# Patient Record
Sex: Female | Born: 1950
Health system: Southern US, Community
[De-identification: ages and names within clinical notes are randomized; demographics above are authoritative.]

## PROBLEM LIST (undated history)

## (undated) DIAGNOSIS — R7303 Prediabetes: Secondary | ICD-10-CM

## (undated) DIAGNOSIS — I10 Essential (primary) hypertension: Secondary | ICD-10-CM

## (undated) DIAGNOSIS — D126 Benign neoplasm of colon, unspecified: Secondary | ICD-10-CM

## (undated) DIAGNOSIS — K5792 Diverticulitis of intestine, part unspecified, without perforation or abscess without bleeding: Secondary | ICD-10-CM

## (undated) DIAGNOSIS — R112 Nausea with vomiting, unspecified: Secondary | ICD-10-CM

## (undated) DIAGNOSIS — M1990 Unspecified osteoarthritis, unspecified site: Secondary | ICD-10-CM

## (undated) DIAGNOSIS — E7800 Pure hypercholesterolemia, unspecified: Secondary | ICD-10-CM

## (undated) DIAGNOSIS — K5790 Diverticulosis of intestine, part unspecified, without perforation or abscess without bleeding: Secondary | ICD-10-CM

## (undated) DIAGNOSIS — Z9889 Other specified postprocedural states: Secondary | ICD-10-CM

## (undated) DIAGNOSIS — Z87442 Personal history of urinary calculi: Secondary | ICD-10-CM

## (undated) HISTORY — PX: TUMOR REMOVAL: SHX12

## (undated) HISTORY — PX: ABDOMINAL HYSTERECTOMY: SHX81

## (undated) HISTORY — PX: OTHER SURGICAL HISTORY: SHX169

## (undated) HISTORY — PX: LITHOTRIPSY: SUR834

## (undated) HISTORY — DX: Unspecified osteoarthritis, unspecified site: M19.90

## (undated) HISTORY — DX: Diverticulosis of intestine, part unspecified, without perforation or abscess without bleeding: K57.90

## (undated) HISTORY — PX: HEEL SPUR SURGERY: SHX665

## (undated) HISTORY — PX: BUNIONECTOMY: SHX129

## (undated) HISTORY — PX: JOINT REPLACEMENT: SHX530

## (undated) HISTORY — PX: KNEE SURGERY: SHX244

## (undated) HISTORY — DX: Benign neoplasm of colon, unspecified: D12.6

## (undated) HISTORY — PX: COLONOSCOPY W/ POLYPECTOMY: SHX1380

## (undated) HISTORY — PX: KNEE ARTHROSCOPY: SHX127

## (undated) HISTORY — DX: Diverticulitis of intestine, part unspecified, without perforation or abscess without bleeding: K57.92

## (undated) SURGERY — OPEN REDUCTION INTERNAL FIXATION FEMORAL SHAFT FRACTURE
Anesthesia: Choice | Laterality: Left

---

## 1998-07-03 ENCOUNTER — Other Ambulatory Visit: Admission: RE | Admit: 1998-07-03 | Discharge: 1998-07-03 | Payer: Self-pay | Admitting: Obstetrics & Gynecology

## 2000-07-08 ENCOUNTER — Other Ambulatory Visit: Admission: RE | Admit: 2000-07-08 | Discharge: 2000-07-08 | Payer: Self-pay | Admitting: *Deleted

## 2000-07-16 ENCOUNTER — Ambulatory Visit (HOSPITAL_COMMUNITY): Admission: RE | Admit: 2000-07-16 | Discharge: 2000-07-16 | Payer: Self-pay | Admitting: Gastroenterology

## 2000-07-22 ENCOUNTER — Inpatient Hospital Stay (HOSPITAL_COMMUNITY): Admission: RE | Admit: 2000-07-22 | Discharge: 2000-07-26 | Payer: Self-pay | Admitting: *Deleted

## 2000-08-09 ENCOUNTER — Ambulatory Visit (HOSPITAL_COMMUNITY): Admission: RE | Admit: 2000-08-09 | Discharge: 2000-08-09 | Payer: Self-pay | Admitting: *Deleted

## 2000-10-13 ENCOUNTER — Inpatient Hospital Stay (HOSPITAL_COMMUNITY): Admission: RE | Admit: 2000-10-13 | Discharge: 2000-10-17 | Payer: Self-pay | Admitting: Surgery

## 2000-10-13 ENCOUNTER — Encounter (INDEPENDENT_AMBULATORY_CARE_PROVIDER_SITE_OTHER): Payer: Self-pay

## 2001-08-30 ENCOUNTER — Other Ambulatory Visit: Admission: RE | Admit: 2001-08-30 | Discharge: 2001-08-30 | Payer: Self-pay | Admitting: *Deleted

## 2001-08-30 ENCOUNTER — Encounter: Admission: RE | Admit: 2001-08-30 | Discharge: 2001-08-30 | Payer: Self-pay | Admitting: *Deleted

## 2001-10-24 ENCOUNTER — Ambulatory Visit (HOSPITAL_COMMUNITY): Admission: RE | Admit: 2001-10-24 | Discharge: 2001-10-24 | Payer: Self-pay | Admitting: *Deleted

## 2001-11-07 ENCOUNTER — Encounter: Payer: Self-pay | Admitting: Surgery

## 2001-11-07 ENCOUNTER — Ambulatory Visit (HOSPITAL_COMMUNITY): Admission: RE | Admit: 2001-11-07 | Discharge: 2001-11-07 | Payer: Self-pay | Admitting: Surgery

## 2002-12-19 ENCOUNTER — Ambulatory Visit (HOSPITAL_COMMUNITY): Admission: RE | Admit: 2002-12-19 | Discharge: 2002-12-19 | Payer: Self-pay | Admitting: Surgery

## 2002-12-19 ENCOUNTER — Encounter: Payer: Self-pay | Admitting: Surgery

## 2003-12-25 ENCOUNTER — Ambulatory Visit (HOSPITAL_COMMUNITY): Admission: RE | Admit: 2003-12-25 | Discharge: 2003-12-25 | Payer: Self-pay | Admitting: Surgery

## 2007-05-24 ENCOUNTER — Encounter: Admission: RE | Admit: 2007-05-24 | Discharge: 2007-05-24 | Payer: Self-pay | Admitting: Surgery

## 2008-08-23 ENCOUNTER — Encounter: Admission: RE | Admit: 2008-08-23 | Discharge: 2008-08-23 | Payer: Self-pay | Admitting: Unknown Physician Specialty

## 2009-12-24 ENCOUNTER — Encounter: Admission: RE | Admit: 2009-12-24 | Discharge: 2009-12-24 | Payer: Self-pay | Admitting: Unknown Physician Specialty

## 2010-09-12 NOTE — Discharge Summary (Signed)
[place]. [facility]  Patient:    Mary Barnett, Mary Barnett                       MRN: [id number] Adm. Date:  [id number] Disc. Date: [id number] Attending:  Marin Comment CC:         [place] Daphine Deutscher, M.D.   Discharge Summary  PROBLEM:  Lower abdominal pain and pelvic mass.  HISTORY OF PRESENT ILLNESS:  For details of the patients admission history and physical, please see the transcribed note dated July 22, 2000.  Briefly, the patient is a relatively asymptomatic woman who presented for routine care with mild complaints of lower abdominal pain.  On examination, she was found to have a large pelvic mass and on sonogram this was felt to be consistent with an ovarian malignancy.  She was brought to the operating room for exploration regarding this mass.  HOSPITAL COURSE:  The patient was taken to the operating room on July 22, 2000, the day of admission.  Total abdominal hysterectomy, bilateral salpingo-oophorectomy, right ureterolysis and exposure and biopsy of retroperitoneal tumor were performed.  The patient had a presacral retroperitoneal tumor approximately 9 cm in size. This mass was not connected in any way to the uterus but this could only be discerned after removing the uterus and removing the ureter from the sidewall of the mass.  Intraoperative consultation was obtained from [facility]. Daphine Deutscher, M.D., who recommended a more thorough work-up of the mass by CT and possible MRI.  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  The patients postoperative course was uncomplicated. She had an epidural anesthesia which controlled her pain well and she needed little pain medication after it was discontinued.  She had a steady improving course throughout the postoperative stay.  She began to take a liquid diet on postoperative day #2 and was advanced rapidly to a regular diet on July 25, 2000.  She was able to be discharged on  July 26, 2000, following abdominal pelvic CT scan.  During her hospitalization she had had several temperature elevations of 101.1.  Once the epidural anesthesia was discontinued, her temperature remained normal.  CONDITION ON DISCHARGE:  Stable and improved pending final diagnosis.  DISCHARGE INSTRUCTIONS:  She was given a discharge instruction sheet regarding activity and use of medications as well as problems to alert the physician.  DISCHARGE MEDICATIONS: 1. She was given a prescription for Darvocet-N 100 #40 to take every four    hours as needed for pain. 2. She was also given a prescription for Chromagen to take one daily.  DISCHARGE LABS:  The patients hemoglobin at the time of discharge was 9.7. She received two doses of p.o. potassium for a serum potassium level of 3.4. Urine culture was obtained on July 25, 2000, and showed no evidence of urinary tract infection. DD:  07/26/00 TD:  07/26/00 Job: [id number]

## 2010-09-12 NOTE — Procedures (Signed)
[place]. [facility]  Patient:    Mary Barnett, Mary Barnett                       MRN: [id number] Proc. Date: 07/22/00 Adm. Date:  [id number] Attending:  Marin Comment                           Procedure Report  PREOPERATIVE DIAGNOSIS:  Abdominal mass versus tumor.  POSTOPERATIVE DIAGNOSIS:  Abdominal mass versus tumor.  OPERATION PERFORMED:  Exploratory laparotomy with total abdominal hysterectomy bilateral salpingo-oophorectomy performed by Dr. Gildardo Griffes.  ANESTHESIA PROCEDURE:  Placement of lumber epidural catheter postoperative analgesia.  Preoperatively, the risks and benefits of placing a lumbar epidural catheter were discussed in detail with the patient.  In addition this has been requested by Dr. Carey Bullocks.  During the case it was determined that this was, in fact, a posteriorly placed mass in the pelvic area and perhaps might have been a tumor of other origin perhaps even a tumor of neuro origin.  Dr. Carey Bullocks did discuss this with me before the end of the case questioning whether we should proceed with epidural since this was in the pelvic area and further away from the lumbar.  I did feel that she would benefit from the epidural placement for postoperative analgesia.  We elected to proceed with the placement of the epidural catheter and will be monitoring her closely for pain control in the next couple of days with the epidural catheter in place.  DESCRIPTION OF PROCEDURE:  The patient was turned to the left lateral decubitus position.  The sterile prep of the lumbar area was conducted.  Using a #17 gauge Tuohy needle adjacent to the L2-3 interspace, the epidural space was contacted with a loss of resistance technique and a catheter threaded with ease approximately 3 to 4 cm beyond the needle tip and the needle was removed. After negative aspiration for both heme and CSF, the catheter was injected with a total of 6 cc of 0.25%  Marcaine which  also contained 100 mcg of fentanyl.  This was secured in place with tape and the patient turned supine, extubated and transferred to the PACU in stable condition.  DISPOSITION:  The patient will be followed daily by the department of anesthesiology for postoperative analgesia via the epidural catheter.DD: 07/22/00 TD:  07/22/00 Job: [id number]

## 2010-09-12 NOTE — Op Note (Signed)
[facility]  Patient:    Mary Barnett, Mary Barnett                    MRN: [id number] Proc. Date: 10/13/00 Adm. Date:  [id number] Attending:  Katha Cabal CC:         [place] Park. Daphine Deutscher, M.D.   Operative Report  INDICATIONS FOR PROCEDURE:  Ms. 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 Daphine Deutscher had a question about the location and condition of the right ureter. They could not identify the right ureter and injected indigo carmine and did not see any blue stain in urine but asked me to scrub in to give my opinion about where the ureter might be.  DESCRIPTION OF PROCEDURE:  After scrubbing in the case, I evaluated the left retroperitoneal space and identified the major iliac vessels on the right side. There was a lot of inflammation and scarring. The area of resection did not look like it included the right ureter. The ureter most likely was excluded from this spindle cell resected area and I felt the ureter was probably intact based on the dissection that had been done, lack of blue stain dye in the wound and Dr. Daphine Deutscher did not feel like he had divided any tubular structures. I then scrubbed out of the case and the procedure was completed by Dr. Daphine Deutscher. DD:  10/13/00 TD:  10/13/00 Job: [id number]

## 2010-09-12 NOTE — Op Note (Signed)
[facility]  Patient:    Mary Barnett, Mary Barnett                    MRN: [id number] Proc. Date: 10/13/00 Adm. Date:  [id number] Attending:  Katha Cabal CC:         Mary Barnett, M.D., [place], [place].  Pershing Cox, M.D.   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  PROCEDURE:  Excision of presacral spindle cell neoplasm.  SURGEON:  Thornton Park. Daphine Deutscher, M.D.  ASSISTANT:  Rose Phi. Maple Hudson, M.D.  INTRAOPERATIVE CONSULTANT:  Maretta Bees. Vonita Moss, M.D.  ANESTHESIA:  General endotracheal.  ESTIMATED BLOOD LOSS:  1400 cc.  DRAINS:  One #14 Blake drain in the pelvis.  DESCRIPTION OF PROCEDURE:  [name] was taken to room #1 and given general anesthesia. The abdomen was prepped with Betadine and draped sterilely. I excised her old scar and entered the abdomen to the midline below the umbilicus without difficulty. Upon entering, I found her omentum stuck up in the anterior abdominal wall and I took this down in a way and was able to reduce the omentum up into the upper abdomen. I found the sigmoid colon and found this and went down into the pelvis and it was pretty well stuck over the mass. I could feel the mass but in the absence of her uterus and her previous hysterectomy, there was a tremendous inflammatory response in that region. I went anterior and began the dissection. It was a blunt dissection staying right on the mass. I was able to once entering the pelvis just get around this and work my way around it. It did generate a fair amount of venous oozing which contributed to the aggregate blood loss. The patient, however, remained stable throughout this. The patient had type and cross match performed. This was a tedious firmly fixed mass which just took time to free up using blunt dissection. I was able to get all the way around this and  ultimately remove the mass and its inferior lobules from the pelvis. This appeared to be coming right on the sacral nerve roots as there is a nice white shiny area right on the bone where this seemed to take its origin. Once removed, it was sent for pathology and because of the nature of the inflammatory process around this, I asked Dr. Vonita Moss to come in and examine her right ureter and we felt that the ureter was lying well anterior to our point of dissection. Also since I had been using blunt dissection and did not transected anything. We did give her methylene blue and did not see any evidence of leakage. I then irrigated the pelvis and then used the electrocautery and clips and sutures to control little oozing areas. In the end right down there on the bone, there was a little bit of oozing which we werent able to control initially with the electrocautery and then applied Surgicel and direct firm pressure and this seemed to control that. Upon irrigation, there was no evidence of active bleeding. I then placed a fully perforated Blake drain into the pelvis and I did close the area that I had entered the pelvis around this and brought this out through a separate stab incision in the right lower quadrant. The omental apron was then brought down over the wound. The wound itself was closed closing the peritoneum with a running #0  Vicryl. A #1 Prolene from above and below was run down to the middle of the incision and tied together. The wound again was irrigated and the skin was closed with vertical mattress sutures of 3-0 nylon as well as staples. The patient seemed to tolerate the procedure well. H&H will be obtained in the recovery room to assess whether she needs to have a transfusion. Epidural was placed by anesthesia. DD:  10/13/00 TD:  10/16/00 Job: [id number]

## 2010-09-12 NOTE — Procedures (Signed)
[place]. [facility]  Patient:    Mary Barnett, Mary Barnett                       MRN: [id number] Proc. Date: 07/16/00 Adm. Date:  [id number] Attending:  Charna Elizabeth CC:         Pershing Cox, M.D.  Thorn[place] Park Daphine Deutscher, M.D.   Procedure Report  DATE OF BIRTH:  1951-03-06  PROCEDURE PERFORMED:  Colonoscopy.  ENDOSCOPIST:  Anselmo Rod, M.D.  INSTRUMENT USED:  Olympus video colonoscope.  INDICATIONS:  A 60 year old white female with a large solid mass in the abdomen, question ovarian cancer and rectal mass on digital examination. Preoperative colonoscopy is being done to clear the colon before resection of the abdominal mass.  Patient had trace guaiac positive stools in the office today.  PREPROCEDURE PREPARATION:  Informed consent was procured from the patient. The patient was fasted for 8 hours prior to the procedure and prepped with a bottle of magnesium citrate and a gallon of NuLytely the night prior to the procedure.  PREPROCEDURE PHYSICAL:  Patient has stable vital signs.  NECK: Supple.  CHEST:  Clear to auscultation. S1, S2 regular.  ABDOMEN:  Soft with normal abdominal bowel sounds, no hepatosplenomegaly.  No definite masses palpable.  On digital examination, there was a large solid mass palpable on digital examination with trace positive stools on occult blood testing.  DESCRIPTION OF PROCEDURE:  The patient was placed in the left lateral decubitus position and sedated with 70 mg of Demerol and 7 mg of Versed intravenously.  Once the patient was adequately sedated and maintained on low-flow oxygen and continuous cardiac monitoring, the Olympus video colonoscope was advanced from the rectum to the cecum without difficulty. Extrinsic compression was noted in the rectum and the rectosigmoid area with no definite mucosal lesions.  The procedure was completed up to the terminal ileum, which appeared healthy and normal, so did the cecum,  right colon, transverse colon and left colon.  Patient had small internal hemorrhoids appreciated on retroflexion and tolerated the procedure well without complication.  IMPRESSION: 1. Extrinsic compression of the rectum and rectosigmoid. 2. Small internal hemorrhoids. 3. Otherwise normal colonoscopy up to the terminal ileum.  RECOMMENDATIONS:  Proceed with surgery for removal of the abdominal masses planned by Dr. Carey Bullocks and Dr. Luretha Murphy on July 22, 2000. DD:  07/16/00 TD:  07/16/00 Job: [id number]

## 2010-09-12 NOTE — Op Note (Signed)
[place]. [facility]  Patient:    Mary Barnett, Mary Barnett                       MRN: [id number] Proc. Date: 07/22/00 Adm. Date:  [id number] Attending:  Marin Comment CC:         Sung Amabile. Roslyn Smiling, M.D.  [place] Daphine Deutscher, M.D.   Operative Report  PREOPERATIVE DIAGNOSIS:  Solid pelvic mass, presumed ovarian carcinoma.  POSTOPERATIVE DIAGNOSES:  Presacral retroperitoneal tumor on frozen section consistent with low-grade spindle tumor.  ANESTHESIA:  General endotracheal anesthesia.  SURGEON:  Pershing Cox, M.D.  ASSISTANT SURGEON:  Sung Amabile. Roslyn Smiling, M.D.  PROCEDURE:  Exploratory laparotomy, total abdominal hysterectomy, bilateral salpingo-oophorectomy, extensive right ureterolysis, exposure and biopsy of retroperitoneal tumor in the presacral space.  INDICATIONS FOR PROCEDURE:  The patient is a 60 year old female who presented with self referral with a 1 month history of lower abdominal pain.  The pain is intermittent and pinching in nature.  It is significantly worsened by having a full bladder.  She has not noted any change in her bowel habits. Prior to surgery she was evaluated by Dr. Anselmo Rod, M.D. who performed a colonoscopy on her.  She found only external pressure.  OPERATIVE FINDINGS:  Exploratory laparotomy showed smooth services of the upper abdomen.  There were no adhesions or implants on the liver or diaphragm surfaces.  The NG tube was palpated in the stomach.  This was later removed prior extubating the patient.  Both kidneys were retroperitoneal and normal in shape and size.  There were no noted periaortic nodes on palpation.  There were no pelvic abnormalities.  No peritoneal implants.  The appendix was normal in appearance.  The uterus was 8 weeks in size with regular shape. Both fallopian tubes and ovaries were normal in appearance.  There was a cyst on the right ovary which ruptured during the BSO.  There was a 8 cm solid  mass arising anterior to the sacrum extending the length of the sacrum down onto the coccyx.  This mass was loosely adherent to the pelvic sidewalls and to the posterior wall of the vagina.  The mass was firm in texture but not hard.  It was easily biopsied with Vim-Silverman needle.  Frozen section diagnoses returned as a low grade spindle tumor, no mitoses seen.  PROCEDURE:  Mary Barnett was brought to the operating room with an IV in place.  In the holding area she received antibiotics, receiving cefazolin 1 gram and gentamycin 60 mg.  She was taken to the operating room where supine on the operating room table general endotracheal anesthesia was administered without difficulty.  She was placed in the frog-legged position and the anterior abdominal wall, perineum and vagina were prepped with a solution of Hibiclens and folic acid was sterilely inserted.  She was then draped for a midline incision.  Subumbilical incision was made and carried down to the top of the symphysis. Subcutaneous tissues were opened with cautery exposing the fascia.  The fascia was divided exposing the underlying rectus muscles which were separated in the midline taking the incision down to the top of the symphysis.  Peritoneum was tented and opened atraumatically.  Large right angle retractors were used to lift the peritoneum and 500 cc of warm saline were instilled and then retrieved.  We actually only retrieved approximately 350 cc of this solution. A right-angled retractor was used to lift the abdomen and an  exam under anesthesia was performed (please see the note above).  Warm moist laps were then used to tack the bowel into the upper abdomen.  Buchwalter retractor was used to give [place] lateral exposure and a bladder blade protected the bladder.  The uterus was 8 weeks in size and normal in shape.  Both fallopian tubes and ovaries appear to be normal in appearance.  There was a cyst on the right ovary.  The  mass was clearly arising from the retroperitoneum.  Because of the position and the proximity to the uterosacral ligament I thought this might be a fibroid arising from the uterosacral.  I was also concerned that this could be an ectopic kidney although it was very low in the pelvis.  I decided at this time to proceed with TAH/BSO which was our planned operative procedure so that I could get safely down into the spaces surrounding this mass and trace the ureter in its full course alongside the mass prior to doing any sort of biopsy.  Round ligaments were identified, suture ligated and transected.  Perineum along the IP ligaments was incised and the IP was gathered as a pedicle.  Care was taken not to include the ureter in this pedicle.  The pedicle was clamped, cut, suture, and free tie ligated.  The peritoneum along the broad ligament was incised lifting the ovaries superiorly onto 2 long Kelly clamps which secured the adnexa lateral to the uterus.  Perineum over the lower uterine segment was incised and the bladder was pushed down.  Uterine arteries were skeletonized, doubly clamped, and suture ligated.  Uterosacral ligaments were clamped with a right angled ______ clamp and suture Heaney ligated.  0 Vicryl sutures were used throughout the dissection.  We were able to enter the vagina on the patients left.  A second clamp was placed on the right and angled sutures were placed on each of these.  The cervix was excised from the upper vagina.  The vagina was whipped with a running locking suture of 0 Vicryl. The hemostasis was good and the vagina was closed anterior to posterior.  Next, lifting the medial peritoneum the ureter was exposed.  It was dissected along its course until it moved laterally through the tunnel.  I was able to convince myself that the ureter did not go into this mass in any way, shape or form.  At this point I began a dissection of the mass from the sidewalls  and  also from the posterior vagina.  We did this by lifting the posterior vagina and using sharp dissection and a series of small clips, each of the pedicles were clipped doubly and then cut.  As we went down the size of this mass it became clear that it was going deep into the pelvis.  Because of its approximation to the sacrum a decision was made to call for intraoperative consult and Dr. Molli Hazard B. Daphine Deutscher, was gracious to come and assist [place].  The patient had received a complete bowel prep prior to surgery in case a colon resection was necessary.  Dr. Daphine Deutscher scrubbed in and you will see his consultation note for the details of evaluation.  He felt that we needed more information on this patient prior to attempting resection and advised biopsy of the mass and then a thorough work-up with CT and perhaps MR prior to excision.  Biopsies were taken from the top of this mass using a Vim-Silverman needle. Biopsy was sent to frozen section and  returned as a low grade spindle cell tumor without mitoses.  Because of the proximity of the ureter to this mass and the almost certainty that it would fall back on to the top of this mass with adherence I took Gelfoam and line the mass with 2 layers separating the ureter from this dissection site as completely as possible.  Moist laps were removed.  Sponge and instrument count was correct.  The rectosigmoid was layered back over the top of this retroperitoneal mass.  All sites of suture were inspected and there was no evidence of bleeding.  Small bowel was drawn down over the rectosigmoid with the omentum over the small bowel.  The anterior abdominal wall was closed with double stranded 0 Prolene suturing from the top and bottom and inverting the stitch in the middle. Subcutaneous tissues made it hemostatic.  The patient received 0.50% Marcaine injected into subcutaneous tissues beneath the skin and the skin staples were then applied.  OPERATIVE  SPECIMENS INCLUDED:  Peritoneal washing, uterus, fallopian tubes, and ovaries, and biopsies of the presacral retroperitoneal mass.  FLUIDS:  2800 cc of crystalloids, 500 cc of Hespan.  ESTIMATED BLOOD LOSS:  300 cc.  URINE OUTPUT:  950 cc.  COMPLICATIONS:  None. DD:  07/22/00 TD:  07/22/00 Job: [id number]

## 2010-09-12 NOTE — Discharge Summary (Signed)
[facility]  Patient:    [name], [name]                    MRN: [id number] Adm. Date:  [id number] Disc. Date: [id number] Attending:  Katha Cabal                           Discharge Summary  ADMISSION DIAGNOSIS:  Pelvic mass.  DISCHARGE DIAGNOSIS:  Presacral benign schwannoma.  COURSE IN HOSPITAL:  Mary Barnett is a 60 year old lady who came in and underwent excision of a presacral squamous cell neoplasm.  She had an estimated blood loss of 1400 cc and had a J placed in her pelvis.  She did get two transfusions.  She seemed to perk up and did well otherwise.  She was ready for discharge on  postop day #4, October 17, 2000.  Arrangements were made for her to come back and see me in the office in two weeks.  At that time, pathology report was pending.  Since then, it has been felt to be a benign squamous cell neoplasm consistent with a benign schwannoma.  CONDITION UPON DISCHARGE:  Good. DD:  11/26/00 TD:  11/26/00 Job: [id number]

## 2010-09-12 NOTE — Consult Note (Signed)
[place]. [facility]  Patient:    Mary Barnett, Mary Barnett                       MRN: [id number] Adm. Date:  [id number] Attending:  Marin Comment CC:         Pershing Cox, M.D.  Anselmo Rod, M.D.   Consultation Report  HISTORY OF PRESENT ILLNESS:  Ms. Oubre had surgery in progress per Dr. Carey Bullocks today and I was called to see the patient in the operating room for a consultation regarding a pelvic mass.  The patient had been found to have a firm mass on rectal exam and workup suggested this was of ovarian origin.  Upon entering and performing a hysterectomy, Dr. Carey Bullocks found this to be more in the presacral area.  Intraoperative exam found this to be a firm, rubbery mass that seemed to have its origin down on the pelvic floor or down near the coccyx.  It was attached somewhat to the ______ of the sacrum.  Since no CT scan or MR were available, I felt that this could possibly be a sarcoma of sorts and might best be managed with biopsy with subsequent studies as indicated, possibly to include a CT scan of the pelvis and also an MR scan.  Multiple Tru-Cut needle biopsies were obtained through the mass and sent for permanent sections and frozen sections.  The patient will be seen postoperatively and further workup undertaken for a subsequent resection. DD:  07/22/00 TD:  07/23/00 Job: [id number]

## 2011-02-13 ENCOUNTER — Other Ambulatory Visit: Payer: Self-pay | Admitting: Family Medicine

## 2011-02-13 DIAGNOSIS — Z1231 Encounter for screening mammogram for malignant neoplasm of breast: Secondary | ICD-10-CM

## 2011-02-19 ENCOUNTER — Ambulatory Visit
Admission: RE | Admit: 2011-02-19 | Discharge: 2011-02-19 | Disposition: A | Discharge status: Home / Self Care | Payer: Commercial Indemnity | Source: Ambulatory Visit | Attending: Family Medicine | Admitting: Family Medicine

## 2011-02-19 DIAGNOSIS — Z1231 Encounter for screening mammogram for malignant neoplasm of breast: Secondary | ICD-10-CM

## 2012-09-13 ENCOUNTER — Other Ambulatory Visit: Payer: Self-pay | Admitting: Family Medicine

## 2012-09-13 ENCOUNTER — Other Ambulatory Visit: Payer: Self-pay

## 2012-09-13 DIAGNOSIS — Z1231 Encounter for screening mammogram for malignant neoplasm of breast: Secondary | ICD-10-CM

## 2012-09-29 ENCOUNTER — Ambulatory Visit
Admission: RE | Admit: 2012-09-29 | Discharge: 2012-09-29 | Disposition: A | Discharge status: Home / Self Care | Payer: Commercial Indemnity | Source: Ambulatory Visit | Attending: Family Medicine | Admitting: Family Medicine

## 2012-09-29 DIAGNOSIS — Z1231 Encounter for screening mammogram for malignant neoplasm of breast: Secondary | ICD-10-CM

## 2013-06-06 ENCOUNTER — Encounter: Payer: Self-pay | Admitting: Podiatrist

## 2013-06-06 ENCOUNTER — Ambulatory Visit (INDEPENDENT_AMBULATORY_CARE_PROVIDER_SITE_OTHER): Payer: Managed Care, Other (non HMO) | Admitting: Podiatrist

## 2013-06-06 ENCOUNTER — Ambulatory Visit (INDEPENDENT_AMBULATORY_CARE_PROVIDER_SITE_OTHER): Payer: Managed Care, Other (non HMO)

## 2013-06-06 VITALS — BP 135/82 | HR 92 | Resp 18

## 2013-06-06 DIAGNOSIS — M79609 Pain in unspecified limb: Secondary | ICD-10-CM

## 2013-06-06 DIAGNOSIS — M722 Plantar fascial fibromatosis: Secondary | ICD-10-CM

## 2013-06-06 MED ORDER — MELOXICAM 15 MG PO TABS: 15.0000 mg | ORAL_TABLET | Freq: Every day | ORAL | Status: DC

## 2013-06-06 MED ORDER — DICLOFENAC SODIUM 1 % TD GEL: 2.0000 g | Freq: Four times a day (QID) | TRANSDERMAL | Status: DC

## 2013-06-06 NOTE — Patient Instructions (Signed)
I am calling in Meloxicam (Mobic)-- it is an antiinflammatory medication-- take it daily with food for 2 weeks, then wean off the medication.    I am also calling in Voltaren Gel-  It is a topical antiinflammatory that works well for the plantar fasciitis.  You may apply it up to 4 times daily and massage into the area of discomfort on your heel.  Plantar Fasciitis (Heel Spur Syndrome) with Rehab The plantar fascia is a fibrous, ligament-like, soft-tissue structure that spans the bottom of the foot. Plantar fasciitis is a condition that causes pain in the foot due to inflammation of the tissue. SYMPTOMS   Pain and tenderness on the underneath side of the foot.  Pain that worsens with standing or walking. CAUSES  Plantar fasciitis is caused by irritation and injury to the plantar fascia on the underneath side of the foot. Common mechanisms of injury include:  Direct trauma to bottom of the foot.  Damage to a small nerve that runs under the foot where the main fascia attaches to the heel bone. Stress placed on the plantar fascia due to any mild increased activity or injury RISK INCREASES WITH:   Obesity.  Poor strength and flexibility.  Improperly fitted shoes.  Tight calf muscles.  Flat feet.  Failure to warm-up properly before activity.  PREVENTION  Warm up and stretch properly before activity.  Strength, flexibility  Maintain a health body weight.  Avoid stress on the plantar fascia.  Wear properly fitted shoes, including arch supports for individuals who have flat feet. PROGNOSIS  If treated properly, then the symptoms of plantar fasciitis usually resolve without surgery. However, occasionally surgery is necessary. RELATED COMPLICATIONS   Recurrent symptoms that may result in a chronic condition.  Problems of the lower back that are caused by compensating for the injury, such as limping.  Pain or weakness of the foot during push-off following surgery.  Chronic  inflammation, scarring, and partial or complete fascia tear, occurring more often from repeated injections. TREATMENT  Treatment initially involves the use of ice and medication to help reduce pain and inflammation. The use of strengthening and stretching exercises may help reduce pain with activity, especially stretches of the Achilles tendon.  Your caregiver may recommend that you use arch supports to help reduce stress on the plantar fascia. Often, corticosteroid injections are given to reduce inflammation. If symptoms persist for greater than 6 months despite non-surgical (conservative), then surgery may be recommended.  MEDICATION   If pain medication is necessary, then nonsteroidal anti-inflammatory medications, such as aspirin and ibuprofen, or other minor pain relievers, such as acetaminophen, are often recommended. Corticosteroid injections may be given by your caregiver.  HEAT AND COLD  Cold treatment (icing) relieves pain and reduces inflammation. Cold treatment should be applied for 10 to 15 minutes every 2 to 3 hours for inflammation and pain and immediately after any activity that aggravates your symptoms. Use ice packs or massage the area with a piece of ice (ice massage).  Heat treatment may be used prior to performing the stretching and strengthening activities prescribed by your caregiver, physical therapist, or athletic trainer. Use a heat pack or soak the injury in warm water. SEEK IMMEDIATE MEDICAL CARE IF:  Treatment seems to offer no benefit, or the condition worsens.  Any medications produce adverse side effects.    EXERCISES-- perform each exercise a total of 10-15 repetitions.  Hold for 30 seconds and perform 3 times per day   RANGE OF MOTION (ROM)  AND STRETCHING EXERCISES - Plantar Fasciitis (Heel Spur Syndrome) These exercises may help you when beginning to rehabilitate your injury.   While completing these exercises, remember:   Restoring tissue flexibility  helps normal motion to return to the joints. This allows healthier, less painful movement and activity.  An effective stretch should be held for at least 30 seconds.  A stretch should never be painful. You should only feel a gentle lengthening or release in the stretched tissue. RANGE OF MOTION - Toe Extension, Flexion  Sit with your right / left leg crossed over your opposite knee.  Grasp your toes and gently pull them back toward the top of your foot. You should feel a stretch on the bottom of your toes and/or foot.  Hold this stretch for __________ seconds.  Now, gently pull your toes toward the bottom of your foot. You should feel a stretch on the top of your toes and or foot.  Hold this stretch for __________ seconds. Repeat __________ times. Complete this stretch __________ times per day.  RANGE OF MOTION - Ankle Dorsiflexion, Active Assisted  Remove shoes and sit on a chair that is preferably not on a carpeted surface.  Place right / left foot under knee. Extend your opposite leg for support.  Keeping your heel down, slide your right / left foot back toward the chair until you feel a stretch at your ankle or calf. If you do not feel a stretch, slide your bottom forward to the edge of the chair, while still keeping your heel down.  Hold this stretch for __________ seconds. Repeat __________ times. Complete this stretch __________ times per day.  STRETCH  Gastroc, Standing  Place hands on wall.  Extend right / left leg, keeping the front knee somewhat bent.  Slightly point your toes inward on your back foot.  Keeping your right / left heel on the floor and your knee straight, shift your weight toward the wall, not allowing your back to arch.  You should feel a gentle stretch in the right / left calf. Hold this position for __________ seconds. Repeat __________ times. Complete this stretch __________ times per day. STRETCH  Soleus, Standing  Place hands on wall.  Extend  right / left leg, keeping the other knee somewhat bent.  Slightly point your toes inward on your back foot.  Keep your right / left heel on the floor, bend your back knee, and slightly shift your weight over the back leg so that you feel a gentle stretch deep in your back calf.  Hold this position for __________ seconds. Repeat __________ times. Complete this stretch __________ times per day. STRETCH  Gastrocsoleus, Standing  Note: This exercise can place a lot of stress on your foot and ankle. Please complete this exercise only if specifically instructed by your caregiver.   Place the ball of your right / left foot on a step, keeping your other foot firmly on the same step.  Hold on to the wall or a rail for balance.  Slowly lift your other foot, allowing your body weight to press your heel down over the edge of the step.  You should feel a stretch in your right / left calf.  Hold this position for __________ seconds.  Repeat this exercise with a slight bend in your right / left knee. Repeat __________ times. Complete this stretch __________ times per day.  STRENGTHENING EXERCISES - Plantar Fasciitis (Heel Spur Syndrome)  These exercises may help you when beginning to  rehabilitate your injury. They may resolve your symptoms with or without further involvement from your physician, physical therapist or athletic trainer. While completing these exercises, remember:   Muscles can gain both the endurance and the strength needed for everyday activities through controlled exercises.  Complete these exercises as instructed by your physician, physical therapist or athletic trainer. Progress the resistance and repetitions only as guided.

## 2013-06-06 NOTE — Progress Notes (Signed)
° °  Subjective:    Patient ID: Mary Barnett, female    DOB: [birth year], 63 y.o.   MRN: [id number]  HPI Mischele presents today for pain in the arch of the left foot.  She states "About two weeks ago I was walking in my house and I felt a pop in the arch of the my left foot and went to the orthopaedic doctor prior to this  and but he didn't do any x -rays and does hurt on top and he did give me an injection and about a month ago and did give me a brace"  She refers to a standard ankle stabilizing brace.  She states cortisone injections give her facial flushing.  She relates she has been diagnosed with arthritis in the foot and has had several injections in the past-- she is concerned that this is not arthritis and possibly plantar fasciitis.     Review of Systems  Musculoskeletal: Positive for gait problem.  All other systems reviewed and are negative.       Objective:   Physical Exam GENERAL APPEARANCE: Alert, conversant. Appropriately groomed. No acute distress.  VASCULAR: Pedal pulses palpable at 2/4 DP and PT bilateral.  Capillary refill time is immediate to all digits,  Proximal to distal cooling it warm to warm.  Digital hair growth is present bilateral  NEUROLOGIC: sensation is intact epicritically and protectively to 5.07 monofilament at 5/5 sites bilateral.  Light touch is intact bilateral, vibratory sensation intact bilateral, achilles tendon reflex is intact bilateral.  MUSCULOSKELETAL: pain on palpation plantar medial arch is noted at the insertion of the plantar fascia on the medial calcaneal tubercle.  Plantar fascia is palpated and appears to be intact from proximal to distal attachments.  No defect is palpated.    DERMATOLOGIC: skin color, texture, and turger are within normal limits.  No preulcerative lesions are seen, no interdigital maceration noted.  No open lesions present.  Digital nails are asymptomatic.    Assessment & Plan:  Plantar fasciitis left  Plan:  Recommended  meloxicam and voltaren gel for her to use.  A plantar fascial taping was applied as well as stretching and massage exercises.  We also discussed the benefit of custom inserts and we will check her insurance coverage for these.  She will be seen in 4 weeks for recheck and may require an injection at that time.

## 2013-07-14 ENCOUNTER — Encounter (HOSPITAL_COMMUNITY): Payer: Self-pay | Admitting: Emergency Medicine

## 2013-07-14 ENCOUNTER — Emergency Department (HOSPITAL_COMMUNITY): Payer: Worker's Compensation | Admitting: Anesthesiology

## 2013-07-14 ENCOUNTER — Encounter (HOSPITAL_COMMUNITY)
Admission: EM | Disposition: A | Discharge status: Home Health | Payer: Self-pay | Source: Home / Self Care | Attending: Orthopedic Surgery

## 2013-07-14 ENCOUNTER — Emergency Department (HOSPITAL_COMMUNITY): Payer: Worker's Compensation

## 2013-07-14 ENCOUNTER — Encounter (HOSPITAL_COMMUNITY): Payer: Worker's Compensation | Admitting: Anesthesiology

## 2013-07-14 ENCOUNTER — Inpatient Hospital Stay (HOSPITAL_COMMUNITY)
Admission: EM | Admit: 2013-07-14 | Discharge: 2013-07-15 | DRG: 482 | Disposition: A | Discharge status: Home Health | Payer: Worker's Compensation | Attending: Orthopedic Surgery | Admitting: Orthopedic Surgery

## 2013-07-14 DIAGNOSIS — Z7982 Long term (current) use of aspirin: Secondary | ICD-10-CM

## 2013-07-14 DIAGNOSIS — S7292XA Unspecified fracture of left femur, initial encounter for closed fracture: Secondary | ICD-10-CM

## 2013-07-14 DIAGNOSIS — X500XXA Overexertion from strenuous movement or load, initial encounter: Secondary | ICD-10-CM | POA: Diagnosis present

## 2013-07-14 DIAGNOSIS — E7800 Pure hypercholesterolemia, unspecified: Secondary | ICD-10-CM | POA: Diagnosis present

## 2013-07-14 DIAGNOSIS — Y92009 Unspecified place in unspecified non-institutional (private) residence as the place of occurrence of the external cause: Secondary | ICD-10-CM

## 2013-07-14 DIAGNOSIS — S72309A Unspecified fracture of shaft of unspecified femur, initial encounter for closed fracture: Principal | ICD-10-CM | POA: Diagnosis present

## 2013-07-14 DIAGNOSIS — Z79899 Other long term (current) drug therapy: Secondary | ICD-10-CM

## 2013-07-14 DIAGNOSIS — S72302A Unspecified fracture of shaft of left femur, initial encounter for closed fracture: Secondary | ICD-10-CM | POA: Diagnosis present

## 2013-07-14 DIAGNOSIS — W010XXA Fall on same level from slipping, tripping and stumbling without subsequent striking against object, initial encounter: Secondary | ICD-10-CM | POA: Diagnosis present

## 2013-07-14 HISTORY — DX: Pure hypercholesterolemia, unspecified: E78.00

## 2013-07-14 HISTORY — PX: FEMUR IM NAIL: SHX1597

## 2013-07-14 LAB — CBC WITH DIFFERENTIAL/PLATELET
BASOS PCT: 0 % (ref 0–1)
Basophils Absolute: 0 10*3/uL (ref 0.0–0.1)
EOS ABS: 0 10*3/uL (ref 0.0–0.7)
EOS PCT: 0 % (ref 0–5)
HCT: 38.7 % (ref 36.0–46.0)
HEMOGLOBIN: 12.9 g/dL (ref 12.0–15.0)
LYMPHS PCT: 10 % — AB (ref 12–46)
Lymphs Abs: 1.2 10*3/uL (ref 0.7–4.0)
MCH: 30.6 pg (ref 26.0–34.0)
MCHC: 33.3 g/dL (ref 30.0–36.0)
MCV: 91.7 fL (ref 78.0–100.0)
MONOS PCT: 6 % (ref 3–12)
Monocytes Absolute: 0.7 10*3/uL (ref 0.1–1.0)
NEUTROS PCT: 83 % — AB (ref 43–77)
Neutro Abs: 9.7 10*3/uL — ABNORMAL HIGH (ref 1.7–7.7)
Platelets: 179 10*3/uL (ref 150–400)
RBC: 4.22 MIL/uL (ref 3.87–5.11)
RDW: 13 % (ref 11.5–15.5)
WBC: 11.7 10*3/uL — AB (ref 4.0–10.5)

## 2013-07-14 LAB — COMPREHENSIVE METABOLIC PANEL
ALT: 24 U/L (ref 0–35)
AST: 21 U/L (ref 0–37)
Albumin: 3.6 g/dL (ref 3.5–5.2)
Alkaline Phosphatase: 56 U/L (ref 39–117)
BUN: 16 mg/dL (ref 6–23)
CO2: 28 meq/L (ref 19–32)
CREATININE: 0.56 mg/dL (ref 0.50–1.10)
Calcium: 9.1 mg/dL (ref 8.4–10.5)
Chloride: 103 mEq/L (ref 96–112)
GFR calc non Af Amer: >90 mL/min (ref >90)
GLUCOSE: 124 mg/dL — AB (ref 70–99)
Potassium: 4.1 mEq/L (ref 3.7–5.3)
Sodium: 143 mEq/L (ref 137–147)
Total Bilirubin: 0.4 mg/dL (ref 0.3–1.2)
Total Protein: 6.5 g/dL (ref 6.0–8.3)

## 2013-07-14 SURGERY — INSERTION, INTRAMEDULLARY ROD, FEMUR
Anesthesia: General | Site: Leg Upper | Laterality: Left

## 2013-07-14 MED ORDER — MIDAZOLAM HCL 2 MG/2ML IJ SOLN
INTRAMUSCULAR | Status: AC
  Filled 2013-07-14: qty 2

## 2013-07-14 MED ORDER — PROPOFOL 10 MG/ML IV BOLUS
INTRAVENOUS | Status: AC
  Filled 2013-07-14: qty 20

## 2013-07-14 MED ORDER — SUFENTANIL CITRATE 50 MCG/ML IV SOLN
INTRAVENOUS | Status: AC
  Filled 2013-07-14: qty 1

## 2013-07-14 MED ORDER — LIDOCAINE HCL (CARDIAC) 20 MG/ML IV SOLN
INTRAVENOUS | Status: AC
  Filled 2013-07-14: qty 5

## 2013-07-14 MED ORDER — SUCCINYLCHOLINE CHLORIDE 20 MG/ML IJ SOLN
INTRAMUSCULAR | Status: AC
  Filled 2013-07-14: qty 1

## 2013-07-14 MED ORDER — MORPHINE SULFATE 4 MG/ML IJ SOLN
4.0000 mg | Freq: Once | INTRAMUSCULAR | Status: AC
  Administered 2013-07-14: 4 mg via INTRAVENOUS
  Filled 2013-07-14: qty 1

## 2013-07-14 MED ORDER — LACTATED RINGERS IV SOLN
INTRAVENOUS | Status: DC | PRN
  Administered 2013-07-14 – 2013-07-15 (×2): via INTRAVENOUS

## 2013-07-14 MED ORDER — SODIUM CHLORIDE 0.9 % IJ SOLN
INTRAMUSCULAR | Status: AC
  Filled 2013-07-14: qty 10

## 2013-07-14 SURGICAL SUPPLY — 46 items
BIT DRILL 3.8X6 NS (BIT) ×1 IMPLANT
BIT DRILL 5.3 NS (BIT) ×1 IMPLANT
BLADE SURG ROTATE 9660 (MISCELLANEOUS) ×1 IMPLANT
CORTICAL BONE SCR 5.0MM X 46MM (Screw) ×2 IMPLANT
COVER SURGICAL LIGHT HANDLE (MISCELLANEOUS) ×4 IMPLANT
DECANTER SPIKE VIAL GLASS SM (MISCELLANEOUS) ×1 IMPLANT
DRAPE STERI IOBAN 125X83 (DRAPES) ×2 IMPLANT
DRSG MEPILEX BORDER 4X4 (GAUZE/BANDAGES/DRESSINGS) ×2 IMPLANT
DRSG MEPILEX BORDER 4X8 (GAUZE/BANDAGES/DRESSINGS) ×2 IMPLANT
DURAPREP 26ML APPLICATOR (WOUND CARE) ×2 IMPLANT
ELECT CAUTERY BLADE 6.4 (BLADE) ×2 IMPLANT
ELECT REM PT RETURN 9FT ADLT (ELECTROSURGICAL) ×2
ELECTRODE REM PT RTRN 9FT ADLT (ELECTROSURGICAL) ×1 IMPLANT
EVACUATOR 1/8 PVC DRAIN (DRAIN) IMPLANT
GAUZE XEROFORM 5X9 LF (GAUZE/BANDAGES/DRESSINGS) ×1 IMPLANT
GLOVE BIOGEL PI IND STRL 8 (GLOVE) ×2 IMPLANT
GLOVE BIOGEL PI INDICATOR 8 (GLOVE) ×2
GLOVE ECLIPSE 7.5 STRL STRAW (GLOVE) ×4 IMPLANT
GOWN STRL REUS W/ TWL LRG LVL3 (GOWN DISPOSABLE) ×1 IMPLANT
GOWN STRL REUS W/ TWL XL LVL3 (GOWN DISPOSABLE) ×2 IMPLANT
GOWN STRL REUS W/TWL LRG LVL3 (GOWN DISPOSABLE) ×2
GOWN STRL REUS W/TWL XL LVL3 (GOWN DISPOSABLE) ×4
GUIDEPIN 3.2X17.5 THRD DISP (PIN) ×1 IMPLANT
GUIDEWIRE BALL NOSE 100CM (WIRE) ×1 IMPLANT
KIT BASIN OR (CUSTOM PROCEDURE TRAY) ×2 IMPLANT
KIT ROOM TURNOVER OR (KITS) ×2 IMPLANT
MANIFOLD NEPTUNE II (INSTRUMENTS) ×2 IMPLANT
NAIL TROCH LH 11MMX38CM (Nail) ×1 IMPLANT
NS IRRIG 1000ML POUR BTL (IV SOLUTION) ×2 IMPLANT
PACK GENERAL/GYN (CUSTOM PROCEDURE TRAY) ×2 IMPLANT
PAD ARMBOARD 7.5X6 YLW CONV (MISCELLANEOUS) ×4 IMPLANT
SCREW ACE CORTICAL (Screw) ×2 IMPLANT
SCREW ACECAP 46MM (Screw) ×1 IMPLANT
SCREW ACECAP 48MM (Screw) ×1 IMPLANT
SCREW BN FT 75X6.5XST DRV (Screw) IMPLANT
SCREW BONE CORTICAL 5.0X52 (Screw) ×1 IMPLANT
SCREW CORT BONE 4.5X42 1402242 (Screw) ×1 IMPLANT
SCREW CORTICL BON 5.0MM X 46MM (Screw) IMPLANT
STAPLER VISISTAT 35W (STAPLE) ×2 IMPLANT
SUT VIC AB 0 CTB1 27 (SUTURE) ×4 IMPLANT
SUT VIC AB 1 CTB1 27 (SUTURE) ×2 IMPLANT
SUT VIC AB 2-0 CTB1 (SUTURE) ×2 IMPLANT
TOWEL OR 17X24 6PK STRL BLUE (TOWEL DISPOSABLE) ×2 IMPLANT
TOWEL OR 17X26 10 PK STRL BLUE (TOWEL DISPOSABLE) ×2 IMPLANT
TRAY FOLEY CATH 16FRSI W/METER (SET/KITS/TRAYS/PACK) ×1 IMPLANT
WATER STERILE IRR 1000ML POUR (IV SOLUTION) ×1 IMPLANT

## 2013-07-14 NOTE — ED Notes (Signed)
Spineboard removed with physician present at approx 2200.  Pt tolerated well.  Reports zero pain at present but can feel beginnings of muscle spasms. Reports last ate/drank at 1130 today.

## 2013-07-14 NOTE — ED Notes (Signed)
Slipped on wet floor (concrete), left leg went under her.  Pain left femur area.

## 2013-07-14 NOTE — H&P (Signed)
Mary Barnett is an 63 y.o. female.  HPI: the patient is an otherwise healthy 63 year old female who was walking across the floor today and had a twisting injury to her left leg.  She suffered immediate severe pain and was taken to the emergency room via EMS.  She was noted to have a spiral femur fracture and we are consulted for management of this.  She denies numbness and tingling in the leg but complains of severe pain even with light motion.  Past Medical History  Diagnosis Date  . Arthritis   . Hypercholesterolemia     Past Surgical History  Procedure Laterality Date  . Carpel tunnel surgery      rt hand  . Heel spur surgery      bone spur rt heel  . Knee surgery      left knee  . Abdominal hysterectomy      History reviewed. No pertinent family history.  Social History:  reports that she has never smoked. She has never used smokeless tobacco. She reports that she does not drink alcohol or use illicit drugs.  Allergies:  Allergies  Allergen Reactions  . Cortisone Itching and Other (See Comments)    flushing of face    Medications: I have reviewed the patient's current medications.  Results for orders placed during the hospital encounter of 07/14/13 (from the past 48 hour(s))  CBC WITH DIFFERENTIAL     Status: Abnormal   Collection Time    07/14/13  9:59 PM      Result Value Ref Range   WBC 11.7 (*) 4.0 - 10.5 K/uL   RBC 4.22  3.87 - 5.11 MIL/uL   Hemoglobin 12.9  12.0 - 15.0 g/dL   HCT 38.7  36.0 - 46.0 %   MCV 91.7  78.0 - 100.0 fL   MCH 30.6  26.0 - 34.0 pg   MCHC 33.3  30.0 - 36.0 g/dL   RDW 13.0  11.5 - 15.5 %   Platelets 179  150 - 400 K/uL   Neutrophils Relative % 83 (*) 43 - 77 %   Neutro Abs 9.7 (*) 1.7 - 7.7 K/uL   Lymphocytes Relative 10 (*) 12 - 46 %   Lymphs Abs 1.2  0.7 - 4.0 K/uL   Monocytes Relative 6  3 - 12 %   Monocytes Absolute 0.7  0.1 - 1.0 K/uL   Eosinophils Relative 0  0 - 5 %   Eosinophils Absolute 0.0  0.0 - 0.7 K/uL   Basophils Relative 0  0 - 1 %   Basophils Absolute 0.0  0.0 - 0.1 K/uL    Dg Chest 1 View  07/14/2013   CLINICAL DATA:  Left femur fracture.  Preop respiratory exam.  EXAM: CHEST - 1 VIEW  COMPARISON:  None.  FINDINGS: The heart size and mediastinal contours are within normal limits. Both lungs are clear. Mild thoracic scoliosis noted.  IMPRESSION: No active cardiopulmonary disease.   Electronically Signed   By: Earle Gell M.D.   On: 07/14/2013 21:58   Dg Femur Left  07/14/2013   CLINICAL DATA:  Fall.  Femur injury and pain.  EXAM: LEFT FEMUR - 2 VIEW  COMPARISON:  None.  FINDINGS: A comminuted fracture of the femoral diaphysis is seen with, with medial displacement and angulation of the distal fracture fragment. Knee osteoarthritis also noted.  IMPRESSION: Comminuted femoral shaft fracture, with medial displacement and angulation.   Electronically Signed   By: Earle Gell  M.D.   On: 07/14/2013 21:56    ROS ROS: I have reviewed the patient's review of systems thoroughly and there are no positive responses as relates to the HPI. Blood pressure 141/92, pulse 109, temperature 98.3 F (36.8 C), temperature source Oral, resp. rate 20, height 5' 6.5" (1.689 m), weight 170 lb (77.111 kg), SpO2 98.00%. Physical Exam Well-developed well-nourished patient in no acute distress. Alert and oriented x3 HEENT:within normal limits Cardiac: Regular rate and rhythm Pulmonary: Lungs clear to auscultation Abdomen: Soft and nontender.  Normal active bowel sounds  Musculoskeletal: left leg has 2+ distal pulses and is neurovascularly intact distally.  There is pain with all range of motion.  There is angular deformity in the mid shaft of the femur. Recent Results (from the past 2160 hour(s))  CBC WITH DIFFERENTIAL     Status: Abnormal   Collection Time    07/14/13  9:59 PM      Result Value Ref Range   WBC 11.7 (*) 4.0 - 10.5 K/uL   RBC 4.22  3.87 - 5.11 MIL/uL   Hemoglobin 12.9  12.0 - 15.0 g/dL   HCT  38.7  36.0 - 46.0 %   MCV 91.7  78.0 - 100.0 fL   MCH 30.6  26.0 - 34.0 pg   MCHC 33.3  30.0 - 36.0 g/dL   RDW 13.0  11.5 - 15.5 %   Platelets 179  150 - 400 K/uL   Neutrophils Relative % 83 (*) 43 - 77 %   Neutro Abs 9.7 (*) 1.7 - 7.7 K/uL   Lymphocytes Relative 10 (*) 12 - 46 %   Lymphs Abs 1.2  0.7 - 4.0 K/uL   Monocytes Relative 6  3 - 12 %   Monocytes Absolute 0.7  0.1 - 1.0 K/uL   Eosinophils Relative 0  0 - 5 %   Eosinophils Absolute 0.0  0.0 - 0.7 K/uL   Basophils Relative 0  0 - 1 %   Basophils Absolute 0.0  0.0 - 0.1 K/uL  COMPREHENSIVE METABOLIC PANEL     Status: Abnormal   Collection Time    07/14/13  9:59 PM      Result Value Ref Range   Sodium 143  137 - 147 mEq/L   Potassium 4.1  3.7 - 5.3 mEq/L   Chloride 103  96 - 112 mEq/L   CO2 28  19 - 32 mEq/L   Glucose, Bld 124 (*) 70 - 99 mg/dL   BUN 16  6 - 23 mg/dL   Creatinine, Ser 0.56  0.50 - 1.10 mg/dL   Calcium 9.1  8.4 - 10.5 mg/dL   Total Protein 6.5  6.0 - 8.3 g/dL   Albumin 3.6  3.5 - 5.2 g/dL   AST 21  0 - 37 U/L   ALT 24  0 - 35 U/L   Alkaline Phosphatase 56  39 - 117 U/L   Total Bilirubin 0.4  0.3 - 1.2 mg/dL   GFR calc non Af Amer >90  >90 mL/min   GFR calc Af Amer >90  >90 mL/min   Comment: (NOTE)     The eGFR has been calculated using the CKD EPI equation.     This calculation has not been validated in all clinical situations.     eGFR's persistently <90 mL/min signify possible Chronic Kidney     Disease.    Assessment/Plan: 63 year old female with spiral fracture of the femur.//The patient was taken operating room for placement of intramedullary  rod.  This will be performed when an operating room is ready.  Nitisha Civello L 07/14/2013, 10:40 PM

## 2013-07-14 NOTE — ED Notes (Signed)
Report to CRNA.

## 2013-07-14 NOTE — ED Provider Notes (Signed)
CSN: [id number]     Arrival date & time 07/14/13  2017 History   First MD Initiated Contact with Patient 07/14/13 2050     Chief Complaint  Patient presents with  . Fall     (Consider location/radiation/quality/duration/timing/severity/associated sxs/prior Treatment) Patient is a 63 y.o. female presenting with leg pain. The history is provided by the patient. No language interpreter was used.  Leg Pain Location:  Leg Time since incident:  1 hour Injury: yes   Mechanism of injury: fall   Fall:    Fall occurred: slipped on wet concrete.   Impact surface:  Concrete   Point of impact:  Unable to specify   Entrapped after fall: no   Leg location:  L upper leg Pain details:    Quality:  Aching   Radiates to:  Does not radiate   Severity:  No pain (No pain when still)   Onset quality:  Sudden   Duration:  1 hour   Timing:  Constant   Progression:  Unchanged Chronicity:  New Dislocation: no   Foreign body present:  No foreign bodies Tetanus status:  Unknown Prior injury to area:  No Relieved by:  Immobilization Worsened by:  Activity Associated symptoms: decreased ROM and swelling   Associated symptoms: no back pain, no fatigue, no fever and no neck pain   Risk factors: no concern for non-accidental trauma, no frequent fractures, no known bone disorder and no obesity     Past Medical History  Diagnosis Date  . Arthritis   . Hypercholesterolemia    Past Surgical History  Procedure Laterality Date  . Carpel tunnel surgery      rt hand  . Heel spur surgery      bone spur rt heel  . Knee surgery      left knee  . Abdominal hysterectomy     History reviewed. No pertinent family history. History  Substance Use Topics  . Smoking status: Never Smoker   . Smokeless tobacco: Never Used  . Alcohol Use: No   OB History   Grav Para Term Preterm Abortions TAB SAB Ect Mult Living                 Review of Systems  Constitutional: Negative for fever, chills, diaphoresis,  activity change, appetite change and fatigue.  HENT: Negative for congestion, facial swelling, rhinorrhea and sore throat.   Eyes: Negative for photophobia and discharge.  Respiratory: Negative for cough, chest tightness and shortness of breath.   Cardiovascular: Negative for chest pain, palpitations and leg swelling.  Gastrointestinal: Negative for nausea, vomiting, abdominal pain and diarrhea.  Endocrine: Negative for polydipsia and polyuria.  Genitourinary: Negative for dysuria, frequency, difficulty urinating and pelvic pain.  Musculoskeletal: Negative for arthralgias, back pain, neck pain and neck stiffness.  Skin: Negative for color change and wound.  Allergic/Immunologic: Negative for immunocompromised state.  Neurological: Negative for facial asymmetry, weakness, numbness and headaches.  Hematological: Does not bruise/bleed easily.  Psychiatric/Behavioral: Negative for confusion and agitation.      Allergies  Cortisone  Home Medications   Current Outpatient Rx  Name  Route  Sig  Dispense  Refill  . aspirin EC 325 MG tablet   Oral   Take 1 tablet (325 mg total) by mouth 2 (two) times daily after a meal.   60 tablet   0   . oxyCODONE-acetaminophen (PERCOCET/ROXICET) 5-325 MG per tablet   Oral   Take 1-2 tablets by mouth every 6 (six) hours as  needed for severe pain.   60 tablet   0    BP 125/77  Pulse 101  Temp(Src) 97.9 F (36.6 C) (Oral)  Resp 16  Ht 5' 6.5" (1.689 m)  Wt 170 lb (77.111 kg)  BMI 27.03 kg/m2  SpO2 99% Physical Exam  Constitutional: She is oriented to person, place, and time. She appears well-developed and well-nourished. No distress.  HENT:  Head: Normocephalic and atraumatic.  Mouth/Throat: No oropharyngeal exudate.  Eyes: Pupils are equal, round, and reactive to light.  Neck: Normal range of motion. Neck supple.  Cardiovascular: Normal rate, regular rhythm and normal heart sounds.  Exam reveals no gallop and no friction rub.   No murmur  heard. Pulmonary/Chest: Effort normal and breath sounds normal. No respiratory distress. She has no wheezes. She has no rales.  Abdominal: Soft. Bowel sounds are normal. She exhibits no distension and no mass. There is no tenderness. There is no rebound and no guarding.  Musculoskeletal: Normal range of motion. She exhibits no edema and no tenderness.       Left knee: Normal. No tenderness found.       Left upper leg: She exhibits deformity.       Legs:      Left foot: She exhibits normal range of motion, no tenderness and normal capillary refill.  Deformity mid L thigh  Neurological: She is alert and oriented to person, place, and time.  Skin: Skin is warm and dry.  Psychiatric: She has a normal mood and affect.    ED Course  Procedures (including critical care time) Labs Review Labs Reviewed  CBC WITH DIFFERENTIAL - Abnormal; Notable for the following:    WBC 11.7 (*)    Neutrophils Relative % 83 (*)    Neutro Abs 9.7 (*)    Lymphocytes Relative 10 (*)    All other components within normal limits  COMPREHENSIVE METABOLIC PANEL - Abnormal; Notable for the following:    Glucose, Bld 124 (*)    All other components within normal limits  CBC - Abnormal; Notable for the following:    WBC 10.7 (*)    RBC 3.79 (*)    Hemoglobin 11.7 (*)    HCT 35.0 (*)    All other components within normal limits  URINALYSIS, ROUTINE W REFLEX MICROSCOPIC   Imaging Review Dg Chest 1 View  07/14/2013   CLINICAL DATA:  Left femur fracture.  Preop respiratory exam.  EXAM: CHEST - 1 VIEW  COMPARISON:  None.  FINDINGS: The heart size and mediastinal contours are within normal limits. Both lungs are clear. Mild thoracic scoliosis noted.  IMPRESSION: No active cardiopulmonary disease.   Electronically Signed   By: Earle Gell M.D.   On: 07/14/2013 21:58   Dg Femur Left  07/15/2013   CLINICAL DATA:  Comminuted left femur fracture.  ORIF.  EXAM: LEFT FEMUR - 2 VIEW; DG C-ARM 1-60 MIN  COMPARISON:   3/20/1,015.  FINDINGS: Five spot images from the C-arm fluoroscopic device, AP and lateral views of the left femur, were submitted for interpretation post operatively. An intramedullary nail has been placed across the comminuted fracture involving the mid diaphysis of the left femur. Alignment near anatomic.  IMPRESSION: Near anatomic alignment post ORIF.   Electronically Signed   By: Evangeline Dakin M.D.   On: 07/15/2013 02:41   Dg Femur Left  07/14/2013   CLINICAL DATA:  Fall.  Femur injury and pain.  EXAM: LEFT FEMUR - 2 VIEW  COMPARISON:  None.  FINDINGS: A comminuted fracture of the femoral diaphysis is seen with, with medial displacement and angulation of the distal fracture fragment. Knee osteoarthritis also noted.  IMPRESSION: Comminuted femoral shaft fracture, with medial displacement and angulation.   Electronically Signed   By: Earle Gell M.D.   On: 07/14/2013 21:56   Dg C-arm 1-60 Min  07/15/2013   CLINICAL DATA:  Comminuted left femur fracture.  ORIF.  EXAM: LEFT FEMUR - 2 VIEW; DG C-ARM 1-60 MIN  COMPARISON:  3/20/1,015.  FINDINGS: Five spot images from the C-arm fluoroscopic device, AP and lateral views of the left femur, were submitted for interpretation post operatively. An intramedullary nail has been placed across the comminuted fracture involving the mid diaphysis of the left femur. Alignment near anatomic.  IMPRESSION: Near anatomic alignment post ORIF.   Electronically Signed   By: Evangeline Dakin M.D.   On: 07/15/2013 02:41     EKG Interpretation None     EKG: sinus tachycardia, biatrial enlargement.   MDM   Final diagnoses:  Femur fracture, left    Pt is a 62 y.o. female with Pmhx as above who presents with deformity of L femur after she slipped on wet concrete floor and L leg bent back behind her.  No head injuries, h/a, or neck pain.  On PE, VSS, pt comfortable when at rest.  +deformity L thigh/  NVI distally.  XR femur shows comminuted fracture of the femoral  diaphysis is seen with, with medial displacement and angulation of the distal fracture fragment.  Pre-op CXR nml. Mild leukocytosis seen on pre-op labs.  [facility] consulted and pt taken from ED to OR for ORIF.           Neta Ehlers, MD 07/15/13 1125

## 2013-07-14 NOTE — Progress Notes (Signed)
Orthopedic Tech Progress Note Patient Details:  Mary Barnett 12-17-1950 [id number]  Musculoskeletal Traction Type of Traction: Wynonia Hazard Traction Traction Location: lle  As ordered by Dr. Dyane Dustman, Shaynna Husby 07/14/2013, 10:38 PM

## 2013-07-14 NOTE — ED Notes (Signed)
Contacted radiology to have pt xray taken asap as she is on spineboard is afraid to be removed re: poss femur fx.  As long as belt is in place mid left femur, pt reports zero pain.

## 2013-07-14 NOTE — ED Notes (Signed)
Patient taken to XR.

## 2013-07-14 NOTE — ED Notes (Signed)
Pt to OR with family accompanying.  Denies pain.

## 2013-07-14 NOTE — ED Notes (Signed)
Denies pain as long as she remains on spine board with leg strapped mid thigh.

## 2013-07-15 ENCOUNTER — Emergency Department (HOSPITAL_COMMUNITY): Payer: Worker's Compensation

## 2013-07-15 ENCOUNTER — Encounter (HOSPITAL_COMMUNITY): Payer: Self-pay | Admitting: Family

## 2013-07-15 DIAGNOSIS — S7292XA Unspecified fracture of left femur, initial encounter for closed fracture: Secondary | ICD-10-CM | POA: Diagnosis present

## 2013-07-15 DIAGNOSIS — S72302A Unspecified fracture of shaft of left femur, initial encounter for closed fracture: Secondary | ICD-10-CM | POA: Diagnosis present

## 2013-07-15 LAB — URINALYSIS, ROUTINE W REFLEX MICROSCOPIC
Bilirubin Urine: NEGATIVE
GLUCOSE, UA: NEGATIVE mg/dL
Hgb urine dipstick: NEGATIVE
KETONES UR: NEGATIVE mg/dL
LEUKOCYTES UA: NEGATIVE
Nitrite: NEGATIVE
PH: 6.5 (ref 5.0–8.0)
PROTEIN: NEGATIVE mg/dL
Specific Gravity, Urine: 1.023 (ref 1.005–1.030)
UROBILINOGEN UA: 1 mg/dL (ref 0.0–1.0)

## 2013-07-15 LAB — CBC
HEMATOCRIT: 35 % — AB (ref 36.0–46.0)
HEMOGLOBIN: 11.7 g/dL — AB (ref 12.0–15.0)
MCH: 30.9 pg (ref 26.0–34.0)
MCHC: 33.4 g/dL (ref 30.0–36.0)
MCV: 92.3 fL (ref 78.0–100.0)
Platelets: 176 10*3/uL (ref 150–400)
RBC: 3.79 MIL/uL — AB (ref 3.87–5.11)
RDW: 13.1 % (ref 11.5–15.5)
WBC: 10.7 10*3/uL — ABNORMAL HIGH (ref 4.0–10.5)

## 2013-07-15 MED ORDER — SODIUM CHLORIDE 0.9 % IV SOLN
INTRAVENOUS | Status: DC
  Administered 2013-07-15: 03:00:00 via INTRAVENOUS

## 2013-07-15 MED ORDER — CEFAZOLIN SODIUM-DEXTROSE 2-3 GM-% IV SOLR
2.0000 g | Freq: Four times a day (QID) | INTRAVENOUS | Status: DC
  Administered 2013-07-15: 2 g via INTRAVENOUS
  Filled 2013-07-15 (×2): qty 50

## 2013-07-15 MED ORDER — OXYCODONE HCL 5 MG/5ML PO SOLN: 5.0000 mg | Freq: Once | ORAL | Status: DC | PRN

## 2013-07-15 MED ORDER — CEFAZOLIN SODIUM-DEXTROSE 2-3 GM-% IV SOLR
INTRAVENOUS | Status: AC
  Filled 2013-07-15: qty 50

## 2013-07-15 MED ORDER — KETOROLAC TROMETHAMINE 30 MG/ML IJ SOLN
15.0000 mg | Freq: Four times a day (QID) | INTRAMUSCULAR | Status: DC
  Administered 2013-07-15 (×2): 15 mg via INTRAVENOUS
  Filled 2013-07-15 (×2): qty 1

## 2013-07-15 MED ORDER — HYDROMORPHONE HCL PF 1 MG/ML IJ SOLN
INTRAMUSCULAR | Status: AC
  Filled 2013-07-15: qty 1

## 2013-07-15 MED ORDER — OXYCODONE-ACETAMINOPHEN 5-325 MG PO TABS: 1.0000 | ORAL_TABLET | Freq: Four times a day (QID) | ORAL | Status: DC | PRN

## 2013-07-15 MED ORDER — ONDANSETRON HCL 4 MG/2ML IJ SOLN
INTRAMUSCULAR | Status: DC | PRN
  Administered 2013-07-15: 4 mg via INTRAVENOUS

## 2013-07-15 MED ORDER — MIDAZOLAM HCL 5 MG/5ML IJ SOLN
INTRAMUSCULAR | Status: DC | PRN
  Administered 2013-07-14: 2 mg via INTRAVENOUS

## 2013-07-15 MED ORDER — HYDROMORPHONE HCL PF 1 MG/ML IJ SOLN: 0.2500 mg | INTRAMUSCULAR | Status: DC | PRN

## 2013-07-15 MED ORDER — CEFAZOLIN SODIUM-DEXTROSE 2-3 GM-% IV SOLR
INTRAVENOUS | Status: DC | PRN
  Administered 2013-07-15: 2 g via INTRAVENOUS

## 2013-07-15 MED ORDER — DEXAMETHASONE SODIUM PHOSPHATE 4 MG/ML IJ SOLN
INTRAMUSCULAR | Status: DC | PRN
  Administered 2013-07-15: 4 mg via INTRAVENOUS

## 2013-07-15 MED ORDER — ONDANSETRON HCL 4 MG/2ML IJ SOLN: 4.0000 mg | Freq: Four times a day (QID) | INTRAMUSCULAR | Status: DC | PRN

## 2013-07-15 MED ORDER — LIDOCAINE HCL (CARDIAC) 20 MG/ML IV SOLN
INTRAVENOUS | Status: DC | PRN
  Administered 2013-07-14: 100 mg via INTRAVENOUS

## 2013-07-15 MED ORDER — 0.9 % SODIUM CHLORIDE (POUR BTL) OPTIME
TOPICAL | Status: DC | PRN
  Administered 2013-07-15: 1000 mL

## 2013-07-15 MED ORDER — SUFENTANIL CITRATE 50 MCG/ML IV SOLN
INTRAVENOUS | Status: DC | PRN
  Administered 2013-07-14: 30 ug via INTRAVENOUS

## 2013-07-15 MED ORDER — PROMETHAZINE HCL 25 MG/ML IJ SOLN: 12.5000 mg | Freq: Four times a day (QID) | INTRAMUSCULAR | Status: DC | PRN

## 2013-07-15 MED ORDER — OXYCODONE-ACETAMINOPHEN 5-325 MG PO TABS
1.0000 | ORAL_TABLET | ORAL | Status: DC | PRN
  Administered 2013-07-15: 1 via ORAL
  Filled 2013-07-15: qty 1

## 2013-07-15 MED ORDER — METHOCARBAMOL 500 MG PO TABS: 500.0000 mg | ORAL_TABLET | Freq: Four times a day (QID) | ORAL | Status: DC | PRN

## 2013-07-15 MED ORDER — ASPIRIN EC 325 MG PO TBEC: 325.0000 mg | DELAYED_RELEASE_TABLET | Freq: Two times a day (BID) | ORAL | Status: DC

## 2013-07-15 MED ORDER — SUCCINYLCHOLINE CHLORIDE 20 MG/ML IJ SOLN
INTRAMUSCULAR | Status: DC | PRN
  Administered 2013-07-14: 140 mg via INTRAVENOUS

## 2013-07-15 MED ORDER — ONDANSETRON HCL 4 MG/2ML IJ SOLN: 4.0000 mg | Freq: Once | INTRAMUSCULAR | Status: DC | PRN

## 2013-07-15 MED ORDER — PHENYLEPHRINE HCL 10 MG/ML IJ SOLN
INTRAMUSCULAR | Status: DC | PRN
  Administered 2013-07-15 (×5): 80 ug via INTRAVENOUS

## 2013-07-15 MED ORDER — ONDANSETRON HCL 4 MG/2ML IJ SOLN
INTRAMUSCULAR | Status: AC
  Filled 2013-07-15: qty 2

## 2013-07-15 MED ORDER — HYDROMORPHONE HCL PF 1 MG/ML IJ SOLN: 1.0000 mg | INTRAMUSCULAR | Status: DC | PRN

## 2013-07-15 MED ORDER — OXYCODONE HCL 5 MG PO TABS: 5.0000 mg | ORAL_TABLET | Freq: Once | ORAL | Status: DC | PRN

## 2013-07-15 MED ORDER — PROPOFOL 10 MG/ML IV BOLUS
INTRAVENOUS | Status: DC | PRN
  Administered 2013-07-14: 110 mg via INTRAVENOUS

## 2013-07-15 MED ORDER — MEPERIDINE HCL 25 MG/ML IJ SOLN: 6.2500 mg | INTRAMUSCULAR | Status: DC | PRN

## 2013-07-15 MED ORDER — KETOROLAC TROMETHAMINE 30 MG/ML IJ SOLN
INTRAMUSCULAR | Status: AC
  Filled 2013-07-15: qty 1

## 2013-07-15 MED ORDER — ASPIRIN EC 325 MG PO TBEC
325.0000 mg | DELAYED_RELEASE_TABLET | Freq: Two times a day (BID) | ORAL | Status: DC
  Administered 2013-07-15: 325 mg via ORAL
  Filled 2013-07-15 (×3): qty 1

## 2013-07-15 MED ORDER — DEXAMETHASONE SODIUM PHOSPHATE 4 MG/ML IJ SOLN
INTRAMUSCULAR | Status: AC
  Filled 2013-07-15: qty 1

## 2013-07-15 MED ORDER — ONDANSETRON HCL 4 MG PO TABS
4.0000 mg | ORAL_TABLET | Freq: Four times a day (QID) | ORAL | Status: DC | PRN
Start: 2013-07-15 — End: 2013-07-15

## 2013-07-15 MED ORDER — GABAPENTIN 300 MG PO CAPS
900.0000 mg | ORAL_CAPSULE | Freq: Three times a day (TID) | ORAL | Status: DC
  Administered 2013-07-15: 900 mg via ORAL
  Filled 2013-07-15 (×3): qty 3

## 2013-07-15 MED ORDER — METHOCARBAMOL 100 MG/ML IJ SOLN: 500.0000 mg | Freq: Four times a day (QID) | INTRAMUSCULAR | Status: DC | PRN

## 2013-07-15 NOTE — Brief Op Note (Signed)
07/14/2013 - 07/15/2013  1:41 AM  PATIENT:  Mary Barnett  63 y.o. female  PRE-OPERATIVE DIAGNOSIS:  left femur fracture  POST-OPERATIVE DIAGNOSIS:  left femur fracture  PROCEDURE:  Procedure(s): INTRAMEDULLARY (IM) NAIL FEMORAL (Left)  SURGEON:  Surgeon(s) and Role:    * [name], MD - Primary  PHYSICIAN ASSISTANT:   ASSISTANTS: bethune   ANESTHESIA:   general  EBL:  Total I/O In: 1000 [I.V.:1000] Out: -   BLOOD ADMINISTERED:none  DRAINS: none   LOCAL MEDICATIONS USED:  NONE  SPECIMEN:  No Specimen  DISPOSITION OF SPECIMEN:  N/A  COUNTS:  YES  TOURNIQUET:  * No tourniquets in log *  DICTATION: .Other Dictation: Dictation Number [id number]  PLAN OF CARE: Admit to inpatient   PATIENT DISPOSITION:  PACU - hemodynamically stable.   Delay start of Pharmacological VTE agent (>24hrs) due to surgical blood loss or risk of bleeding: no

## 2013-07-15 NOTE — Discharge Instructions (Signed)
Ambulate Touch down wgt bearing on the left leg Ice your left thigh as needed for swelling.

## 2013-07-15 NOTE — Anesthesia Preprocedure Evaluation (Signed)
Anesthesia Evaluation  Patient identified by MRN, date of birth, ID band Patient awake    Reviewed: Allergy & Precautions, H&P , NPO status , Patient's Chart, lab work & pertinent test results  Airway Mallampati: I TM Distance: >3 FB Neck ROM: Full    Dental   Pulmonary          Cardiovascular     Neuro/Psych    GI/Hepatic   Endo/Other    Renal/GU      Musculoskeletal   Abdominal   Peds  Hematology   Anesthesia Other Findings   Reproductive/Obstetrics                           Anesthesia Physical Anesthesia Plan  ASA: II  Anesthesia Plan: General   Post-op Pain Management:    Induction: Intravenous  Airway Management Planned: Oral ETT  Additional Equipment:   Intra-op Plan:   Post-operative Plan: Extubation in OR  Informed Consent: I have reviewed the patients History and Physical, chart, labs and discussed the procedure including the risks, benefits and alternatives for the proposed anesthesia with the patient or authorized representative who has indicated his/her understanding and acceptance.     Plan Discussed with: CRNA and Surgeon  Anesthesia Plan Comments:         Anesthesia Quick Evaluation  

## 2013-07-15 NOTE — Progress Notes (Signed)
Subjective: 1 Day Post-Op Procedure(s) (LRB): INTRAMEDULLARY (IM) NAIL FEMORAL (Left) Patient reports pain as 3 on 0-10 scale.  Patient not out of bed yet, but complains of minimal if any pain.  Foley catheter is still intact, but will be discontinued by nursing.  Taking by mouth okay.  She wants to go home today if possible.   Objective: Vital signs in last 24 hours: Temp:  [97.5 F (36.4 C)-98.3 F (36.8 C)] 97.9 F (36.6 C) (03/21 0515) Pulse Rate:  [95-117] 101 (03/21 0515) Resp:  [13-20] 16 (03/21 0515) BP: (117-157)/(55-100) 125/77 mmHg (03/21 0515) SpO2:  [97 %-100 %] 99 % (03/21 0515) Weight:  [77.111 kg (170 lb)] 77.111 kg (170 lb) (03/20 2024)  Intake/Output from previous day: 03/20 0701 - 03/21 0700 In: 1900 [I.V.:1900] Out: 200 [Urine:100; Blood:100] Intake/Output this shift:     Recent Labs  07/14/13 2159 07/15/13 0340  HGB 12.9 11.7*    Recent Labs  07/14/13 2159 07/15/13 0340  WBC 11.7* 10.7*  RBC 4.22 3.79*  HCT 38.7 35.0*  PLT 179 176    Recent Labs  07/14/13 2159  NA 143  K 4.1  CL 103  CO2 28  BUN 16  CREATININE 0.56  GLUCOSE 124*  CALCIUM 9.1   No results found for this basename: LABPT, INR,  in the last 72 hours Left leg exam: Neurovascular intact Sensation intact distally Intact pulses distally Dorsiflexion/Plantar flexion intact Incision: dressing C/D/I Compartment soft  Assessment/Plan: 1 Day Post-Op Procedure(s) (LRB): INTRAMEDULLARY (IM) NAIL FEMORAL (Left) Plan: Discontinue Foley catheter. Discharge home with home health Touchdown weightbearing on left leg. Rx for Percocet 5 mg as needed for pain and enteric-coated aspirin 325 mg twice daily x1 month. Followup with Dr. Berenice Primas in 2 weeks.  Mary Barnett 07/15/2013, 9:48 AM

## 2013-07-15 NOTE — Care Management Note (Signed)
  CARE MANAGEMENT NOTE 07/15/2013  Patient:  Mary Barnett, Mary Barnett   Account Number:  [id number]  Date Initiated:  07/15/2013  Documentation initiated by:  Ricki Miller  Subjective/Objective Assessment:   63 yr old female s/p left femur comminuted displaced fracture s/p Intramedullary rod fixation of fracture.     Action/Plan:   Case manager spoke with patient concerning home health and DME needs at discharge. She states she is under worker's comp. CM will conact Selena Flemin on 3/23 [phone].  Will get DME through [facility]. Pt has family support at discharge.   Anticipated DC Date:  07/15/2013   Anticipated DC Plan:  [place] [facility]  CM consult      [facility] Choice  HOME HEALTH  DURABLE MEDICAL EQUIPMENT   Choice offered to / List presented to:     DME arranged  [facility]  3-N-1  TUB BENCH      DME agency  [place] arranged  HH-2 PT      [place] agency  OTHER - SEE NOTE   Status of service:  In process, will continue to follow Medicare Important Message given?   (If response is "NO", the following Medicare IM given date fields will be blank) Date Medicare IM given:   Date Additional Medicare IM given:    Discharge Disposition:  [place]

## 2013-07-15 NOTE — Evaluation (Signed)
Physical Therapy Evaluation Patient Details Name: Mary Barnett MRN: [id number] DOB: [birth year] Today's Date: 07/15/2013 Time: 6799-3273 PT Time Calculation (min): 35 min  PT Assessment / Plan / Recommendation History of Present Illness  Pt s/p fall at work sustaining spiral fx left femur.  Underwend IM nailing.  Clinical Impression  Patient very motivated to go home today.  Patient demonstrates understanding and safety with mobility.  Educated patient on TDWB, gait, stairs and tub transfer.  Patient with supportive family.  Feel patient is safe for d/c today with family and f/u PT.      PT Assessment  All further PT needs can be met in the next venue of care    Follow Up Recommendations  Home health PT    Does the patient have the potential to tolerate intense rehabilitation      Barriers to Discharge        Equipment Recommendations  Rolling walker with 5" wheels;3in1 (PT);Other (comment) (tub bench)    Recommendations for Other Services     Frequency      Precautions / Restrictions Precautions Precautions: Fall Restrictions LLE Weight Bearing: Touchdown weight bearing   Pertinent Vitals/Pain Denise pain      Mobility  Bed Mobility Overal bed mobility: Needs Assistance Bed Mobility: Supine to Sit Supine to sit: Min assist General bed mobility comments: for left LE only  Transfers Overall transfer level: Needs assistance Equipment used: Rolling walker (2 wheeled) Transfers: Sit to/from Stand Sit to Stand: Min guard General transfer comment: cueing for proper hand placement; also educated/practice transfer on/off tub bench for shower. Ambulation/Gait Ambulation/Gait assistance: Min guard Ambulation Distance (Feet): 20 Feet (x 3) Assistive device: Rolling walker (2 wheeled) Gait Pattern/deviations: Step-to pattern General Gait Details: patient able to maintain TDWB.  Ambulated at appropriate speed for injury. Stairs: Yes Stairs assistance: Min assist Stair  Management: Backwards Number of Stairs: 1 General stair comments: min assist for safety    Exercises General Exercises - Lower Extremity Ankle Circles/Pumps: AROM;Both;10 reps;Seated Quad Sets: AROM;Both;10 reps;Seated Long Arc Quad: AROM;Left;10 reps;Seated   PT Diagnosis: Acute pain  PT Problem List: Decreased range of motion;Decreased activity tolerance;Decreased balance;Decreased mobility PT Treatment Interventions:       PT Goals(Current goals can be found in the care plan section) Acute Rehab PT Goals Patient Stated Goal: go home today PT Goal Formulation: No goals set, d/c therapy  Visit Information  Last PT Received On: 07/15/13 Assistance Needed: +1 History of Present Illness: Pt s/p fall at work sustaining spiral fx left femur.  Underwend IM nailing.       Prior Functioning  Home Living Family/patient expects to be discharged to:: Private residence Living Arrangements: Spouse/significant other;Children Available Help at Discharge: Family Type of Home: House Home Access: Stairs to enter Secretary/administrator of Steps: 1 Entrance Stairs-Rails: None Home Layout: One level Home Equipment: None Prior Function Level of Independence: Independent Communication Communication: No difficulties    Cognition  Cognition Arousal/Alertness: Awake/alert Behavior During Therapy: WFL for tasks assessed/performed Overall Cognitive Status: Within Functional Limits for tasks assessed    Extremity/Trunk Assessment Upper Extremity Assessment Upper Extremity Assessment: Overall WFL for tasks assessed Lower Extremity Assessment Lower Extremity Assessment: LLE deficits/detail LLE Deficits / Details: knee extension 3/5 - no resistance given; ankle DF/PF at least 3/5 - no resistance given   Balance Balance Overall balance assessment: Needs assistance Sitting-balance support: No upper extremity supported Sitting balance-Leahy Scale: Good Standing balance support: Bilateral upper  extremity supported Standing balance-Leahy Scale:  Poor Standing balance comment: needs RW to maintain balance due to TDWB.  End of Session PT - End of Session Equipment Utilized During Treatment: Gait belt Activity Tolerance: Patient tolerated treatment well Patient left: in chair;with call bell/phone within reach;with family/visitor present Nurse Communication: Mobility status  GP     Shanna Cisco, [place] 07/15/2013, 10:45 AM

## 2013-07-15 NOTE — Progress Notes (Signed)
Discharge instructions given to and reviewed with patient. Patient denies questions or concerns. IV removed. Patient discharged via wheelchair with personal belongings, discharge packet, and prescriptions.

## 2013-07-15 NOTE — Discharge Summary (Signed)
Patient ID: DENISHA HOEL MRN: [id number] DOB/AGE: November 19, 1950 63 y.o.  Admit date: 07/14/2013 Discharge date: 07/15/2013  Admission Diagnoses:  Principal Problem:   Femur fracture, left Active Problems:   Left femoral shaft fracture   Discharge Diagnoses:  Same  Past Medical History  Diagnosis Date  . Arthritis   . Hypercholesterolemia     Surgeries: Procedure(s):left INTRAMEDULLARY (IM) NAIL FEMORAL on 07/14/2013 - 07/15/2013    Discharged Condition: Improved  Hospital Course: WILMETTA SPEISER is an 63 y.o. female who was admitted 07/14/2013 for operative treatment ofFemur fracture, left. Patient has severe unremitting pain that affects sleep, daily activities, and work/hobbies. After pre-op clearance the patient was taken to the operating room on 07/14/2013 - 07/15/2013 and underwent  Procedure(s):left INTRAMEDULLARY (IM) NAIL FEMORAL.    Patient was given perioperative antibiotics: Anti-infectives   Start     Dose/Rate Route Frequency Ordered Stop   07/15/13 0600  ceFAZolin (ANCEF) IVPB 2 g/50 mL premix     2 g 100 mL/hr over 30 Minutes Intravenous Every 6 hours 07/15/13 0243 07/15/13 2359       Patient was given sequential compression devices, early ambulation, and chemoprophylaxis to prevent DVT.  Patient benefited maximally from hospital stay and there were no complications.    Recent vital signs: Patient Vitals for the past 24 hrs:  BP Temp Temp src Pulse Resp SpO2 Height Weight  07/15/13 0515 125/77 mmHg 97.9 F (36.6 C) - 101 16 99 % - -  07/15/13 0241 130/74 mmHg 97.5 F (36.4 C) - 99 16 100 % - -  07/15/13 0224 118/59 mmHg 97.6 F (36.4 C) - 99 13 100 % - -  07/15/13 0219 118/57 mmHg - - 101 13 100 % - -  07/15/13 0215 119/61 mmHg - - 101 13 100 % - -  07/15/13 0154 117/55 mmHg 97.7 F (36.5 C) - 103 16 99 % - -  07/14/13 2315 139/100 mmHg - - 112 19 99 % - -  07/14/13 2300 131/89 mmHg - - 109 16 100 % - -  07/14/13 2245 149/86 mmHg - - 95 13 99 % - -   07/14/13 2230 - - - 116 20 100 % - -  07/14/13 2215 157/87 mmHg - - 117 14 97 % - -  07/14/13 2200 142/85 mmHg - - 104 - 100 % - -  07/14/13 2100 136/83 mmHg - - 106 - 99 % - -  07/14/13 2045 141/92 mmHg - - 109 - 98 % - -  07/14/13 2024 153/88 mmHg 98.3 F (36.8 C) Oral 108 20 99 % 5' 6.5" (1.689 m) 77.111 kg (170 lb)     Recent laboratory studies:  Recent Labs  07/14/13 2159 07/15/13 0340  WBC 11.7* 10.7*  HGB 12.9 11.7*  HCT 38.7 35.0*  PLT 179 176  NA 143  --   K 4.1  --   CL 103  --   CO2 28  --   BUN 16  --   CREATININE 0.56  --   GLUCOSE 124*  --   CALCIUM 9.1  --      Discharge Medications:     Medication List    STOP taking these medications       naproxen sodium 220 MG tablet  Commonly known as:  ANAPROX      TAKE these medications       aspirin EC 325 MG tablet  Take 1 tablet (325 mg total) by mouth 2 (two)  times daily after a meal.     cholecalciferol 1000 UNITS tablet  Commonly known as:  VITAMIN D  Take 1,000 Units by mouth daily.     Fish Oil 1000 MG Caps  Take 1 capsule by mouth daily.     gabapentin 300 MG capsule  Commonly known as:  NEURONTIN  Take 900 mg by mouth 3 (three) times daily.     oxyCODONE-acetaminophen 5-325 MG per tablet  Commonly known as:  PERCOCET/ROXICET  Take 1-2 tablets by mouth every 6 (six) hours as needed for severe pain.     simvastatin 20 MG tablet  Commonly known as:  ZOCOR  Take 20 mg by mouth every morning.     vitamin C 500 MG tablet  Commonly known as:  ASCORBIC ACID  Take 500 mg by mouth daily.     VITAMIN E PO  Take 1 capsule by mouth daily.        Diagnostic Studies: Dg Chest 1 View  07/14/2013   CLINICAL DATA:  Left femur fracture.  Preop respiratory exam.  EXAM: CHEST - 1 VIEW  COMPARISON:  None.  FINDINGS: The heart size and mediastinal contours are within normal limits. Both lungs are clear. Mild thoracic scoliosis noted.  IMPRESSION: No active cardiopulmonary disease.   Electronically  Signed   By: Earle Gell M.D.   On: 07/14/2013 21:58   Dg Femur Left  07/15/2013   CLINICAL DATA:  Comminuted left femur fracture.  ORIF.  EXAM: LEFT FEMUR - 2 VIEW; DG C-ARM 1-60 MIN  COMPARISON:  3/20/1,015.  FINDINGS: Five spot images from the C-arm fluoroscopic device, AP and lateral views of the left femur, were submitted for interpretation post operatively. An intramedullary nail has been placed across the comminuted fracture involving the mid diaphysis of the left femur. Alignment near anatomic.  IMPRESSION: Near anatomic alignment post ORIF.   Electronically Signed   By: Evangeline Dakin M.D.   On: 07/15/2013 02:41   Dg Femur Left  07/14/2013   CLINICAL DATA:  Fall.  Femur injury and pain.  EXAM: LEFT FEMUR - 2 VIEW  COMPARISON:  None.  FINDINGS: A comminuted fracture of the femoral diaphysis is seen with, with medial displacement and angulation of the distal fracture fragment. Knee osteoarthritis also noted.  IMPRESSION: Comminuted femoral shaft fracture, with medial displacement and angulation.   Electronically Signed   By: Earle Gell M.D.   On: 07/14/2013 21:56   Dg C-arm 1-60 Min  07/15/2013   CLINICAL DATA:  Comminuted left femur fracture.  ORIF.  EXAM: LEFT FEMUR - 2 VIEW; DG C-ARM 1-60 MIN  COMPARISON:  3/20/1,015.  FINDINGS: Five spot images from the C-arm fluoroscopic device, AP and lateral views of the left femur, were submitted for interpretation post operatively. An intramedullary nail has been placed across the comminuted fracture involving the mid diaphysis of the left femur. Alignment near anatomic.  IMPRESSION: Near anatomic alignment post ORIF.   Electronically Signed   By: Evangeline Dakin M.D.   On: 07/15/2013 02:41    Disposition: home.  Will need home health physical therapy.      Discharge Orders   Future Orders Complete By Expires   Call MD / Call 911  As directed    Comments:     If you experience chest pain or shortness of breath, CALL 911 and be transported to  the hospital emergency room.  If you develope a fever above 101 F, pus (white drainage) or increased drainage  or redness at the wound, or calf pain, call your surgeon's office.   Constipation Prevention  As directed    Comments:     Drink plenty of fluids.  Prune juice may be helpful.  You may use a stool softener, such as Colace (over the counter) 100 mg twice a day.  Use MiraLax (over the counter) for constipation as needed.   Diet general  As directed    Increase activity slowly as tolerated  As directed    Touch down weight bearing  As directed    Questions:     Laterality:  left   Extremity:  Lower      Follow-up Information   Follow up with GRAVES,JOHN L, MD. Schedule an appointment as soon as possible for a visit in 2 weeks.   Specialty:  Orthopedic Surgery   Contact information:   [place] [address] [phone]        Signed: Erlene Senters 07/15/2013, 10:08 AM

## 2013-07-15 NOTE — Anesthesia Postprocedure Evaluation (Signed)
Anesthesia Post Note  Patient: Mary Barnett  Procedure(s) Performed: Procedure(s) (LRB): INTRAMEDULLARY (IM) NAIL FEMORAL (Left)  Anesthesia type: general  Patient location: PACU  Post pain: Pain level controlled  Post assessment: Patient's Cardiovascular Status Stable  Last Vitals:  Filed Vitals:   07/15/13 0515  BP: 125/77  Pulse: 101  Temp: 36.6 C  Resp: 16    Post vital signs: Reviewed and stable  Level of consciousness: sedated  Complications: No apparent anesthesia complications

## 2013-07-15 NOTE — Anesthesia Procedure Notes (Signed)
Procedure Name: Intubation Date/Time: 07/14/2013 11:57 PM Performed by: Claris Che Pre-anesthesia Checklist: Patient identified, Emergency Drugs available, Suction available and Patient being monitored Patient Re-evaluated:Patient Re-evaluated prior to inductionOxygen Delivery Method: Circle system utilized Preoxygenation: Pre-oxygenation with 100% oxygen Intubation Type: IV induction, Rapid sequence and Cricoid Pressure applied Ventilation: Mask ventilation without difficulty Laryngoscope Size: Mac and 3 Grade View: Grade II Tube type: Oral Tube size: 8.0 mm Number of attempts: 1 Airway Equipment and Method: Stylet and Oral airway Placement Confirmation: ETT inserted through vocal cords under direct vision,  positive ETCO2 and breath sounds checked- equal and bilateral Secured at: 22 cm Tube secured with: Tape Dental Injury: Teeth and Oropharynx as per pre-operative assessment

## 2013-07-15 NOTE — Transfer of Care (Signed)
Immediate Anesthesia Transfer of Care Note  Patient: Mary Barnett  Procedure(s) Performed: Procedure(s): INTRAMEDULLARY (IM) NAIL FEMORAL (Left)  Patient Location: PACU  Anesthesia Type:General  Level of Consciousness: oriented, sedated, patient cooperative and responds to stimulation  Airway & Oxygen Therapy: Patient Spontanous Breathing and Patient connected to nasal cannula oxygen  Post-op Assessment: Report given to PACU RN, Post -op Vital signs reviewed and stable and Patient moving all extremities X 4  Post vital signs: Reviewed and stable  Complications: No apparent anesthesia complications

## 2013-07-15 NOTE — Op Note (Signed)
NAMEREMY, DIA NO.:  [id number]  MEDICAL RECORD NO.:  [id number]  LOCATION:  [facility]                         FACILITY:  [facility]  PHYSICIAN:  [name], M.D.   DATE OF BIRTH:  02/20/1951  DATE OF PROCEDURE:  07/15/2013 DATE OF DISCHARGE:                              OPERATIVE REPORT   PREOPERATIVE DIAGNOSIS:  Comminuted displaced femur fracture, left.  POSTOPERATIVE DIAGNOSIS:  Comminuted displaced femur fracture, left.  PROCEDURE: #1 Intramedullary rod fixation of left comminuted displaced femur fracture.#2 interpretation of multiple intraoperative fluoroscopic images  SURGEON:  [name], MD  ASSIST:  Gary Fleet, PA  ANESTHESIA:  General.  BRIEF HISTORY:  Mary Barnett is a 63 year old female who slipped and fell and twisted her leg.  She suffered a spiral fracture of the femur.  We were consulted for management.  She was taken to the operating room for intramedullary rod fixation.  DESCRIPTION OF PROCEDURE:  The patient was taken to the operating room. After adequate anesthesia was obtained with general anesthetic, the patient was placed supine on the operating room table.  The left leg was then prepped and draped.  She was then placed on the fracture table after general anesthetic had been administered.  Once she was placed on the fracture table, preliminary guides were identified and following this, a small incision made just proximal to the  subcutaneous tissue down the level of the trochanter and a guidewire was placed down the center of the femur in both AP and lateral fluoro.  Once this was completed, a guidewire was advanced across the fracture site.  Distally, she had a single fracture then a spiral butterfly.  We advanced across both of these.  Once we were centered in the femur, we measured to a 38 and started reaming her and got good chatter at 11, reamed up to 12.5 and put a 38 x 11 mm rod in place, locked it proximally with a  guide distally with a freehand technique.  We  did get a little bit of valgus at the fracture site.  We were trying to get the distal interlock again. We felt that it was certainly within acceptable limits.  The distal interlocks were placed.  Once this was achieved, the wounds were irrigated, suctioned dry, closed in layers.  Sterile compressive dressing was applied.  The patient was taken to the recovery where she was noted to be in satisfactory condition.  Estimated blood loss for the procedure was minimal.    [name], M.D.    Mary Barnett  D:  07/15/2013  T:  07/15/2013  Job:  [id number]

## 2013-07-17 NOTE — Progress Notes (Signed)
Telephone conversation with daughter Abriel Hattery) Physical Therapy 07/17/13 16:10  Received call from daughter with questions regarding her mother's mobility.  States her mother is having more trouble with mobility than she did in the hospital, especially with sit to stand.   Reminded daughter that her mother is TDWB LLE.  She voiced understanding. Asked if HHPT has visited yet. - No.  Daughter states they are to call [facility] agency tomorrow to set up first appointment. Asked if her mother was able to get to the edge of the bed. - Yes.  So encouraged patient to sit EOB for meals and bathing.  She can also do exercises at this level for UE strengthening. Asked if her mother can transfer to [facility] with assist. - Yes.   At this point, encouraged her to have her mother continue to ambulate short distances as able (safely), and transfer to [facility].   Daughter states that she felt that the HHPT would think patient should be "walking all over the house".  Reassured daughter that we need to make sure that her mom is safe with the mobility that she can do.  And that the HHPT will know that her mom is limited in what she can do.  The HHPT will work with her mom beginning at her current functional level.   Daughter appreciative of feedback and suggestions.  No further questions. Carita Pian Sanjuana Kava, [place] Pager [phone]

## 2013-07-18 ENCOUNTER — Encounter (HOSPITAL_COMMUNITY): Payer: Self-pay | Admitting: Orthopedic Surgery

## 2013-11-29 ENCOUNTER — Other Ambulatory Visit: Payer: Self-pay

## 2013-11-29 DIAGNOSIS — Z1231 Encounter for screening mammogram for malignant neoplasm of breast: Secondary | ICD-10-CM

## 2013-12-07 ENCOUNTER — Ambulatory Visit: Payer: Self-pay

## 2013-12-14 ENCOUNTER — Ambulatory Visit: Payer: Self-pay

## 2013-12-18 ENCOUNTER — Ambulatory Visit
Admission: RE | Admit: 2013-12-18 | Discharge: 2013-12-18 | Disposition: A | Discharge status: Home / Self Care | Payer: Commercial Indemnity | Source: Ambulatory Visit

## 2013-12-18 DIAGNOSIS — Z1231 Encounter for screening mammogram for malignant neoplasm of breast: Secondary | ICD-10-CM

## 2014-09-18 DIAGNOSIS — Z96652 Presence of left artificial knee joint: Secondary | ICD-10-CM | POA: Insufficient documentation

## 2014-11-05 ENCOUNTER — Other Ambulatory Visit: Payer: Self-pay | Admitting: Orthopedic Surgery

## 2014-11-22 ENCOUNTER — Other Ambulatory Visit: Payer: Self-pay | Admitting: Orthopedic Surgery

## 2014-11-26 ENCOUNTER — Encounter (HOSPITAL_COMMUNITY): Payer: Self-pay

## 2014-11-26 ENCOUNTER — Encounter (HOSPITAL_COMMUNITY)
Admission: RE | Admit: 2014-11-26 | Discharge: 2014-11-26 | Disposition: A | Discharge status: Home / Self Care | Payer: Worker's Compensation | Source: Ambulatory Visit | Attending: Orthopedic Surgery | Admitting: Orthopedic Surgery

## 2014-11-26 DIAGNOSIS — E780 Pure hypercholesterolemia: Secondary | ICD-10-CM | POA: Diagnosis not present

## 2014-11-26 DIAGNOSIS — Z7982 Long term (current) use of aspirin: Secondary | ICD-10-CM | POA: Insufficient documentation

## 2014-11-26 DIAGNOSIS — I498 Other specified cardiac arrhythmias: Secondary | ICD-10-CM | POA: Insufficient documentation

## 2014-11-26 DIAGNOSIS — Z79899 Other long term (current) drug therapy: Secondary | ICD-10-CM | POA: Insufficient documentation

## 2014-11-26 DIAGNOSIS — I10 Essential (primary) hypertension: Secondary | ICD-10-CM | POA: Insufficient documentation

## 2014-11-26 DIAGNOSIS — Z0183 Encounter for blood typing: Secondary | ICD-10-CM | POA: Insufficient documentation

## 2014-11-26 DIAGNOSIS — J9811 Atelectasis: Secondary | ICD-10-CM | POA: Diagnosis not present

## 2014-11-26 DIAGNOSIS — Z01812 Encounter for preprocedural laboratory examination: Secondary | ICD-10-CM | POA: Insufficient documentation

## 2014-11-26 DIAGNOSIS — Z01818 Encounter for other preprocedural examination: Secondary | ICD-10-CM | POA: Diagnosis present

## 2014-11-26 HISTORY — DX: Other specified postprocedural states: Z98.890

## 2014-11-26 HISTORY — DX: Essential (primary) hypertension: I10

## 2014-11-26 HISTORY — DX: Other specified postprocedural states: R11.2

## 2014-11-26 HISTORY — DX: Personal history of urinary calculi: Z87.442

## 2014-11-26 LAB — URINALYSIS, ROUTINE W REFLEX MICROSCOPIC
BILIRUBIN URINE: NEGATIVE
Glucose, UA: NEGATIVE mg/dL
Hgb urine dipstick: NEGATIVE
KETONES UR: NEGATIVE mg/dL
Nitrite: NEGATIVE
PH: 5 (ref 5.0–8.0)
PROTEIN: NEGATIVE mg/dL
Specific Gravity, Urine: 1.031 — ABNORMAL HIGH (ref 1.005–1.030)
Urobilinogen, UA: 0.2 mg/dL (ref 0.0–1.0)

## 2014-11-26 LAB — CBC WITH DIFFERENTIAL/PLATELET
BASOS ABS: 0.1 10*3/uL (ref 0.0–0.1)
Basophils Relative: 1 % (ref 0–1)
Eosinophils Absolute: 0.1 10*3/uL (ref 0.0–0.7)
Eosinophils Relative: 2 % (ref 0–5)
HCT: 41.5 % (ref 36.0–46.0)
HEMOGLOBIN: 13.7 g/dL (ref 12.0–15.0)
LYMPHS PCT: 40 % (ref 12–46)
Lymphs Abs: 2.2 10*3/uL (ref 0.7–4.0)
MCH: 30.2 pg (ref 26.0–34.0)
MCHC: 33 g/dL (ref 30.0–36.0)
MCV: 91.6 fL (ref 78.0–100.0)
MONO ABS: 0.3 10*3/uL (ref 0.1–1.0)
Monocytes Relative: 6 % (ref 3–12)
NEUTROS ABS: 2.9 10*3/uL (ref 1.7–7.7)
Neutrophils Relative %: 51 % (ref 43–77)
PLATELETS: 209 10*3/uL (ref 150–400)
RBC: 4.53 MIL/uL (ref 3.87–5.11)
RDW: 12.6 % (ref 11.5–15.5)
WBC: 5.6 10*3/uL (ref 4.0–10.5)

## 2014-11-26 LAB — PROTIME-INR
INR: 0.95 (ref 0.00–1.49)
Prothrombin Time: 12.9 seconds (ref 11.6–15.2)

## 2014-11-26 LAB — SURGICAL PCR SCREEN
MRSA, PCR: NEGATIVE
Staphylococcus aureus: NEGATIVE

## 2014-11-26 LAB — COMPREHENSIVE METABOLIC PANEL
ALK PHOS: 44 U/L (ref 38–126)
ALT: 18 U/L (ref 14–54)
ANION GAP: 10 (ref 5–15)
AST: 23 U/L (ref 15–41)
Albumin: 3.7 g/dL (ref 3.5–5.0)
BILIRUBIN TOTAL: 0.5 mg/dL (ref 0.3–1.2)
BUN: 17 mg/dL (ref 6–20)
CALCIUM: 9.4 mg/dL (ref 8.9–10.3)
CO2: 25 mmol/L (ref 22–32)
Chloride: 107 mmol/L (ref 101–111)
Creatinine, Ser: 0.79 mg/dL (ref 0.44–1.00)
GFR calc Af Amer: >60 mL/min (ref >60)
GFR calc non Af Amer: >60 mL/min (ref >60)
Glucose, Bld: 116 mg/dL — ABNORMAL HIGH (ref 65–99)
Potassium: 4.1 mmol/L (ref 3.5–5.1)
SODIUM: 142 mmol/L (ref 135–145)
Total Protein: 6.5 g/dL (ref 6.5–8.1)

## 2014-11-26 LAB — URINE MICROSCOPIC-ADD ON

## 2014-11-26 LAB — TYPE AND SCREEN
ABO/RH(D): O POS
Antibody Screen: NEGATIVE

## 2014-11-26 LAB — APTT: aPTT: 28 seconds (ref 24–37)

## 2014-11-26 LAB — ABO/RH: ABO/RH(D): O POS

## 2014-11-26 NOTE — Progress Notes (Signed)
Patient denied having any acute cardiac or pulmonary issues. PCP is Cyndy Freeze.

## 2014-11-26 NOTE — Pre-Procedure Instructions (Signed)
SAMYRIA RUDIE  11/26/2014     Your procedure is scheduled on : Friday December 07, 2014 at 12:50 PM.  Report to [facility] Admitting at 10:50 A.M.  Call this number if you have problems the morning of surgery: [phone]    Remember:  Do not eat food or drink liquids after midnight.  Take these medicines the morning of surgery with A SIP OF WATER : NONE   Please stop taking any vitamins, herbal medications, Naproxen/Aleve, Biotin, Ibuprofen, Advil, Motrin, etc on Friday August 5th   Do not wear jewelry, make-up or nail polish.  Do not wear lotions, powders, or perfumes.   Do not shave 48 hours prior to surgery.    Do not bring valuables to the hospital.  [facility] is not responsible for any belongings or valuables.  Contacts, dentures or bridgework may not be worn into surgery.  Leave your suitcase in the car.  After surgery it may be brought to your room.  For patients admitted to the hospital, discharge time will be determined by your treatment team.  Patients discharged the day of surgery will not be allowed to drive home.   Name and phone number of your driver:    Special instructions:  Shower using CHG soap the night before and the morning of your surgery  Please read over the following fact sheets that you were given. Pain Booklet, Coughing and Deep Breathing, Blood Transfusion Information, Total Joint Packet, MRSA Information and Surgical Site Infection Prevention

## 2014-11-27 NOTE — Progress Notes (Signed)
Anesthesia Chart Review:  Pt is 64 year old female scheduled for computer assisted L total knee arthroplasty, removal of hardware from distal femur on 12/07/2014 with Dr. Berenice Primas.   PMH includes: HTN, hypercholesterolemia. Never smoker. BMI 30. S/p femoral IM nail 07/14/13.   Anesthesia history includes post op nausea and vomiting.   Medications include: ASA, benazepril, simvastatin.   Preoperative labs reviewed.    Chest x-ray 11/26/2014 reviewed. Mild L base subsegmental atelectasis.   EKG 11/26/2014: Normal sinus rhythm with sinus arrhythmia  If no changes, I anticipate pt can proceed with surgery as scheduled.   Willeen Cass, FNP-BC [facility]/Anesthesiology Phone: [phone] 11/27/2014 4:08 PM

## 2014-12-06 MED ORDER — CHLORHEXIDINE GLUCONATE 4 % EX LIQD: 60.0000 mL | CUTANEOUS | Status: DC

## 2014-12-06 MED ORDER — CEFAZOLIN SODIUM-DEXTROSE 2-3 GM-% IV SOLR
2.0000 g | INTRAVENOUS | Status: AC
  Administered 2014-12-07: 2 g via INTRAVENOUS
  Filled 2014-12-06: qty 50

## 2014-12-06 NOTE — Progress Notes (Signed)
Pt notified of new arrival time of 0900.Verbalized understanding.

## 2014-12-07 ENCOUNTER — Inpatient Hospital Stay (HOSPITAL_COMMUNITY): Payer: Worker's Compensation | Admitting: Emergency Medicine

## 2014-12-07 ENCOUNTER — Inpatient Hospital Stay (HOSPITAL_COMMUNITY): Payer: Worker's Compensation | Admitting: Certified Registered"

## 2014-12-07 ENCOUNTER — Encounter (HOSPITAL_COMMUNITY)
Admission: RE | Disposition: A | Discharge status: Home Health | Payer: Self-pay | Source: Ambulatory Visit | Attending: Orthopedic Surgery

## 2014-12-07 ENCOUNTER — Inpatient Hospital Stay (HOSPITAL_COMMUNITY)
Admission: RE | Admit: 2014-12-07 | Discharge: 2014-12-09 | DRG: 470 | Disposition: A | Discharge status: Home Health | Payer: Worker's Compensation | Source: Ambulatory Visit | Attending: Orthopedic Surgery | Admitting: Orthopedic Surgery

## 2014-12-07 DIAGNOSIS — Z791 Long term (current) use of non-steroidal anti-inflammatories (NSAID): Secondary | ICD-10-CM | POA: Diagnosis not present

## 2014-12-07 DIAGNOSIS — M1732 Unilateral post-traumatic osteoarthritis, left knee: Secondary | ICD-10-CM | POA: Diagnosis present

## 2014-12-07 DIAGNOSIS — E780 Pure hypercholesterolemia: Secondary | ICD-10-CM | POA: Diagnosis present

## 2014-12-07 DIAGNOSIS — M1712 Unilateral primary osteoarthritis, left knee: Secondary | ICD-10-CM | POA: Diagnosis present

## 2014-12-07 DIAGNOSIS — Z888 Allergy status to other drugs, medicaments and biological substances status: Secondary | ICD-10-CM

## 2014-12-07 DIAGNOSIS — I10 Essential (primary) hypertension: Secondary | ICD-10-CM | POA: Diagnosis present

## 2014-12-07 DIAGNOSIS — S72302S Unspecified fracture of shaft of left femur, sequela: Secondary | ICD-10-CM

## 2014-12-07 HISTORY — PX: KNEE ARTHROPLASTY: SHX992

## 2014-12-07 SURGERY — ARTHROPLASTY, KNEE, TOTAL, USING IMAGELESS COMPUTER-ASSISTED NAVIGATION
Anesthesia: Spinal | Site: Knee | Laterality: Left

## 2014-12-07 MED ORDER — ACETAMINOPHEN 650 MG RE SUPP: 650.0000 mg | Freq: Four times a day (QID) | RECTAL | Status: DC | PRN

## 2014-12-07 MED ORDER — OXYCODONE-ACETAMINOPHEN 5-325 MG PO TABS
1.0000 | ORAL_TABLET | ORAL | Status: DC | PRN
  Administered 2014-12-08 – 2014-12-09 (×5): 2 via ORAL
  Filled 2014-12-07 (×6): qty 2

## 2014-12-07 MED ORDER — PROPOFOL 10 MG/ML IV BOLUS
INTRAVENOUS | Status: AC
  Filled 2014-12-07: qty 20

## 2014-12-07 MED ORDER — BUPIVACAINE LIPOSOME 1.3 % IJ SUSP
20.0000 mL | INTRAMUSCULAR | Status: AC
  Administered 2014-12-07: 20 mL
  Filled 2014-12-07: qty 20

## 2014-12-07 MED ORDER — HYDROMORPHONE HCL 1 MG/ML IJ SOLN
1.0000 mg | INTRAMUSCULAR | Status: DC | PRN
  Administered 2014-12-08 – 2014-12-09 (×3): 1 mg via INTRAVENOUS
  Filled 2014-12-07 (×3): qty 1

## 2014-12-07 MED ORDER — ONDANSETRON HCL 4 MG/2ML IJ SOLN
INTRAMUSCULAR | Status: DC | PRN
  Administered 2014-12-07: 8 mg via INTRAVENOUS

## 2014-12-07 MED ORDER — ASPIRIN EC 325 MG PO TBEC: 325.0000 mg | DELAYED_RELEASE_TABLET | Freq: Two times a day (BID) | ORAL | Status: DC

## 2014-12-07 MED ORDER — MIDAZOLAM HCL 5 MG/5ML IJ SOLN
INTRAMUSCULAR | Status: DC | PRN
Start: 2014-12-07 — End: 2014-12-07
  Administered 2014-12-07: 2 mg via INTRAVENOUS

## 2014-12-07 MED ORDER — ASPIRIN EC 325 MG PO TBEC
325.0000 mg | DELAYED_RELEASE_TABLET | Freq: Two times a day (BID) | ORAL | Status: DC
  Administered 2014-12-08 – 2014-12-09 (×3): 325 mg via ORAL
  Filled 2014-12-07 (×4): qty 1

## 2014-12-07 MED ORDER — PROPOFOL INFUSION 10 MG/ML OPTIME
INTRAVENOUS | Status: DC | PRN
  Administered 2014-12-07: 60 ug/kg/min via INTRAVENOUS

## 2014-12-07 MED ORDER — FENTANYL CITRATE (PF) 100 MCG/2ML IJ SOLN
INTRAMUSCULAR | Status: DC | PRN
  Administered 2014-12-07: 150 ug via INTRAVENOUS

## 2014-12-07 MED ORDER — HYDROMORPHONE HCL 1 MG/ML IJ SOLN
INTRAMUSCULAR | Status: AC
  Filled 2014-12-07: qty 1

## 2014-12-07 MED ORDER — POLYETHYLENE GLYCOL 3350 17 G PO PACK: 17.0000 g | PACK | Freq: Every day | ORAL | Status: DC | PRN

## 2014-12-07 MED ORDER — FENTANYL CITRATE (PF) 250 MCG/5ML IJ SOLN
INTRAMUSCULAR | Status: AC
  Filled 2014-12-07: qty 5

## 2014-12-07 MED ORDER — BISACODYL 5 MG PO TBEC: 5.0000 mg | DELAYED_RELEASE_TABLET | Freq: Every day | ORAL | Status: DC | PRN

## 2014-12-07 MED ORDER — DIPHENHYDRAMINE HCL 12.5 MG/5ML PO ELIX: 12.5000 mg | ORAL_SOLUTION | ORAL | Status: DC | PRN

## 2014-12-07 MED ORDER — SCOPOLAMINE 1 MG/3DAYS TD PT72
1.0000 | MEDICATED_PATCH | Freq: Once | TRANSDERMAL | Status: DC
  Administered 2014-12-07: 1.5 mg via TRANSDERMAL
  Filled 2014-12-07: qty 1

## 2014-12-07 MED ORDER — ONDANSETRON HCL 4 MG PO TABS: 4.0000 mg | ORAL_TABLET | Freq: Four times a day (QID) | ORAL | Status: DC | PRN

## 2014-12-07 MED ORDER — PHENYLEPHRINE HCL 10 MG/ML IJ SOLN
INTRAMUSCULAR | Status: DC | PRN
  Administered 2014-12-07 (×2): 80 ug via INTRAVENOUS

## 2014-12-07 MED ORDER — LACTATED RINGERS IV SOLN
INTRAVENOUS | Status: DC
  Administered 2014-12-07 (×3): via INTRAVENOUS

## 2014-12-07 MED ORDER — MAGNESIUM CITRATE PO SOLN: 1.0000 | Freq: Once | ORAL | Status: DC | PRN

## 2014-12-07 MED ORDER — ONDANSETRON HCL 4 MG/2ML IJ SOLN
4.0000 mg | Freq: Four times a day (QID) | INTRAMUSCULAR | Status: DC | PRN
  Administered 2014-12-09: 4 mg via INTRAVENOUS
  Filled 2014-12-07: qty 2

## 2014-12-07 MED ORDER — METHOCARBAMOL 750 MG PO TABS: 750.0000 mg | ORAL_TABLET | Freq: Three times a day (TID) | ORAL | Status: DC | PRN

## 2014-12-07 MED ORDER — MIDAZOLAM HCL 2 MG/2ML IJ SOLN
INTRAMUSCULAR | Status: AC
  Filled 2014-12-07: qty 4

## 2014-12-07 MED ORDER — MEPERIDINE HCL 25 MG/ML IJ SOLN: 6.2500 mg | INTRAMUSCULAR | Status: DC | PRN

## 2014-12-07 MED ORDER — LIDOCAINE HCL (CARDIAC) 20 MG/ML IV SOLN
INTRAVENOUS | Status: DC | PRN
  Administered 2014-12-07: 50 mg via INTRAVENOUS

## 2014-12-07 MED ORDER — ZOLPIDEM TARTRATE 5 MG PO TABS: 5.0000 mg | ORAL_TABLET | Freq: Every evening | ORAL | Status: DC | PRN

## 2014-12-07 MED ORDER — PROMETHAZINE HCL 25 MG/ML IJ SOLN: 6.2500 mg | INTRAMUSCULAR | Status: DC | PRN

## 2014-12-07 MED ORDER — ACETAMINOPHEN 325 MG PO TABS: 650.0000 mg | ORAL_TABLET | Freq: Four times a day (QID) | ORAL | Status: DC | PRN

## 2014-12-07 MED ORDER — ALUM & MAG HYDROXIDE-SIMETH 200-200-20 MG/5ML PO SUSP: 30.0000 mL | ORAL | Status: DC | PRN

## 2014-12-07 MED ORDER — CEFAZOLIN SODIUM-DEXTROSE 2-3 GM-% IV SOLR
2.0000 g | Freq: Four times a day (QID) | INTRAVENOUS | Status: AC
  Administered 2014-12-07: 2 g via INTRAVENOUS
  Filled 2014-12-07 (×2): qty 50

## 2014-12-07 MED ORDER — KETOROLAC TROMETHAMINE 15 MG/ML IJ SOLN
15.0000 mg | Freq: Four times a day (QID) | INTRAMUSCULAR | Status: AC
  Administered 2014-12-07 – 2014-12-08 (×2): 15 mg via INTRAVENOUS
  Filled 2014-12-07 (×4): qty 1

## 2014-12-07 MED ORDER — SIMVASTATIN 20 MG PO TABS
20.0000 mg | ORAL_TABLET | Freq: Every morning | ORAL | Status: DC
  Administered 2014-12-08 – 2014-12-09 (×2): 20 mg via ORAL
  Filled 2014-12-07 (×2): qty 1

## 2014-12-07 MED ORDER — BUPIVACAINE IN DEXTROSE 0.75-8.25 % IT SOLN
INTRATHECAL | Status: DC | PRN
  Administered 2014-12-07: 15 mg via INTRATHECAL

## 2014-12-07 MED ORDER — OXYCODONE-ACETAMINOPHEN 5-325 MG PO TABS: 1.0000 | ORAL_TABLET | ORAL | Status: DC | PRN

## 2014-12-07 MED ORDER — DEXAMETHASONE SODIUM PHOSPHATE 4 MG/ML IJ SOLN
INTRAMUSCULAR | Status: DC | PRN
  Administered 2014-12-07: 4 mg via INTRAVENOUS

## 2014-12-07 MED ORDER — MIDAZOLAM HCL 2 MG/2ML IJ SOLN: 0.5000 mg | Freq: Once | INTRAMUSCULAR | Status: DC | PRN

## 2014-12-07 MED ORDER — PROMETHAZINE HCL 25 MG/ML IJ SOLN: 12.5000 mg | Freq: Four times a day (QID) | INTRAMUSCULAR | Status: DC | PRN

## 2014-12-07 MED ORDER — METHOCARBAMOL 1000 MG/10ML IJ SOLN
500.0000 mg | Freq: Four times a day (QID) | INTRAVENOUS | Status: DC | PRN
  Administered 2014-12-07: 500 mg via INTRAVENOUS
  Filled 2014-12-07 (×3): qty 5

## 2014-12-07 MED ORDER — PROPOFOL 10 MG/ML IV BOLUS
INTRAVENOUS | Status: DC | PRN
  Administered 2014-12-07: 50 mg via INTRAVENOUS
  Administered 2014-12-07: 80 mg via INTRAVENOUS
  Administered 2014-12-07 (×2): 50 mg via INTRAVENOUS

## 2014-12-07 MED ORDER — DOCUSATE SODIUM 100 MG PO CAPS
100.0000 mg | ORAL_CAPSULE | Freq: Two times a day (BID) | ORAL | Status: DC
  Administered 2014-12-07 – 2014-12-09 (×4): 100 mg via ORAL
  Filled 2014-12-07 (×4): qty 1

## 2014-12-07 MED ORDER — SODIUM CHLORIDE 0.9 % IR SOLN
Status: DC | PRN
  Administered 2014-12-07: 1000 mL
  Administered 2014-12-07: 3000 mL

## 2014-12-07 MED ORDER — SODIUM CHLORIDE 0.9 % IV SOLN
INTRAVENOUS | Status: DC
  Administered 2014-12-07 – 2014-12-08 (×2): via INTRAVENOUS

## 2014-12-07 MED ORDER — BUPIVACAINE HCL (PF) 0.5 % IJ SOLN
INTRAMUSCULAR | Status: AC
  Filled 2014-12-07: qty 20

## 2014-12-07 MED ORDER — HYDROMORPHONE HCL 1 MG/ML IJ SOLN
0.2500 mg | INTRAMUSCULAR | Status: DC | PRN
  Administered 2014-12-07: 0.5 mg via INTRAVENOUS

## 2014-12-07 MED ORDER — TRANEXAMIC ACID 1000 MG/10ML IV SOLN
1000.0000 mg | INTRAVENOUS | Status: AC
  Administered 2014-12-07: 1000 mg via INTRAVENOUS
  Filled 2014-12-07: qty 10

## 2014-12-07 MED ORDER — BENAZEPRIL HCL 20 MG PO TABS
40.0000 mg | ORAL_TABLET | Freq: Every day | ORAL | Status: DC
  Administered 2014-12-08 – 2014-12-09 (×2): 40 mg via ORAL
  Filled 2014-12-07 (×3): qty 2

## 2014-12-07 MED ORDER — METHOCARBAMOL 500 MG PO TABS
500.0000 mg | ORAL_TABLET | Freq: Four times a day (QID) | ORAL | Status: DC | PRN
Start: 2014-12-07 — End: 2014-12-09
  Administered 2014-12-08 – 2014-12-09 (×2): 500 mg via ORAL
  Filled 2014-12-07 (×3): qty 1

## 2014-12-07 SURGICAL SUPPLY — 68 items
APL SKNCLS STERI-STRIP NONHPOA (GAUZE/BANDAGES/DRESSINGS)
BANDAGE ESMARK 6X9 LF (GAUZE/BANDAGES/DRESSINGS) ×1 IMPLANT
BENZOIN TINCTURE PRP APPL 2/3 (GAUZE/BANDAGES/DRESSINGS) ×1 IMPLANT
BLADE SAGITTAL 25.0X1.19X90 (BLADE) ×2 IMPLANT
BLADE SAW SAG 90X13X1.27 (BLADE) ×2 IMPLANT
BNDG CMPR 9X6 STRL LF SNTH (GAUZE/BANDAGES/DRESSINGS) ×1
BNDG ESMARK 6X9 LF (GAUZE/BANDAGES/DRESSINGS) ×2
BOWL SMART MIX CTS (DISPOSABLE) ×2 IMPLANT
CAP KNEE TOTAL 3 SIGMA ×1 IMPLANT
CEMENT HV SMART SET (Cement) ×4 IMPLANT
COVER BACK TABLE 24X17X13 BIG (DRAPES) IMPLANT
COVER SURGICAL LIGHT HANDLE (MISCELLANEOUS) ×2 IMPLANT
CUFF TOURNIQUET SINGLE 34IN LL (TOURNIQUET CUFF) ×2 IMPLANT
CUFF TOURNIQUET SINGLE 44IN (TOURNIQUET CUFF) IMPLANT
DECANTER SPIKE VIAL GLASS SM (MISCELLANEOUS) ×1 IMPLANT
DRAPE EXTREMITY T 121X128X90 (DRAPE) ×2 IMPLANT
DRAPE IMP U-DRAPE 54X76 (DRAPES) ×2 IMPLANT
DRAPE U-SHAPE 47X51 STRL (DRAPES) ×2 IMPLANT
DRSG AQUACEL AG ADV 3.5X10 (GAUZE/BANDAGES/DRESSINGS) ×1 IMPLANT
DRSG PAD ABDOMINAL 8X10 ST (GAUZE/BANDAGES/DRESSINGS) ×2 IMPLANT
DURAPREP 26ML APPLICATOR (WOUND CARE) ×2 IMPLANT
ELECT REM PT RETURN 9FT ADLT (ELECTROSURGICAL) ×2
ELECTRODE REM PT RTRN 9FT ADLT (ELECTROSURGICAL) ×1 IMPLANT
EVACUATOR 1/8 PVC DRAIN (DRAIN) ×2 IMPLANT
FACESHIELD WRAPAROUND (MASK) ×2 IMPLANT
FACESHIELD WRAPAROUND OR TEAM (MASK) ×1 IMPLANT
GAUZE SPONGE 4X4 12PLY STRL (GAUZE/BANDAGES/DRESSINGS) ×2 IMPLANT
GAUZE XEROFORM 5X9 LF (GAUZE/BANDAGES/DRESSINGS) ×2 IMPLANT
GLOVE BIOGEL PI IND STRL 8 (GLOVE) ×2 IMPLANT
GLOVE BIOGEL PI INDICATOR 8 (GLOVE) ×2
GLOVE ECLIPSE 7.5 STRL STRAW (GLOVE) ×4 IMPLANT
GOWN STRL REUS W/ TWL LRG LVL3 (GOWN DISPOSABLE) ×1 IMPLANT
GOWN STRL REUS W/ TWL XL LVL3 (GOWN DISPOSABLE) ×2 IMPLANT
GOWN STRL REUS W/TWL LRG LVL3 (GOWN DISPOSABLE) ×2
GOWN STRL REUS W/TWL XL LVL3 (GOWN DISPOSABLE) ×6
HANDPIECE INTERPULSE COAX TIP (DISPOSABLE) ×2
HOOD PEEL AWAY FACE SHEILD DIS (HOOD) ×5 IMPLANT
IMMOBILIZER KNEE 20 (SOFTGOODS) IMPLANT
IMMOBILIZER KNEE 22 UNIV (SOFTGOODS) ×2 IMPLANT
IMMOBILIZER KNEE 24 THIGH 36 (MISCELLANEOUS) IMPLANT
IMMOBILIZER KNEE 24 UNIV (MISCELLANEOUS)
KIT BASIN OR (CUSTOM PROCEDURE TRAY) ×2 IMPLANT
KIT ROOM TURNOVER OR (KITS) ×2 IMPLANT
MANIFOLD NEPTUNE II (INSTRUMENTS) ×2 IMPLANT
MARKER SPHERE PSV REFLC THRD 5 (MARKER) ×6 IMPLANT
NDL HYPO 25GX1X1/2 BEV (NEEDLE) IMPLANT
NEEDLE HYPO 25GX1X1/2 BEV (NEEDLE) IMPLANT
NS IRRIG 1000ML POUR BTL (IV SOLUTION) ×2 IMPLANT
PACK TOTAL JOINT (CUSTOM PROCEDURE TRAY) ×2 IMPLANT
PACK UNIVERSAL I (CUSTOM PROCEDURE TRAY) ×2 IMPLANT
PAD ARMBOARD 7.5X6 YLW CONV (MISCELLANEOUS) ×4 IMPLANT
PAD CAST 4YDX4 CTTN HI CHSV (CAST SUPPLIES) ×1 IMPLANT
PADDING CAST COTTON 4X4 STRL (CAST SUPPLIES) ×2
PADDING CAST COTTON 6X4 STRL (CAST SUPPLIES) ×1 IMPLANT
PIN SCHANZ 4MM 130MM (PIN) ×8 IMPLANT
SET HNDPC FAN SPRY TIP SCT (DISPOSABLE) ×1 IMPLANT
STAPLER VISISTAT 35W (STAPLE) IMPLANT
STRIP CLOSURE SKIN 1/2X4 (GAUZE/BANDAGES/DRESSINGS) ×1 IMPLANT
SUCTION FRAZIER TIP 10 FR DISP (SUCTIONS) ×2 IMPLANT
SUT MNCRL AB 3-0 PS2 18 (SUTURE) ×1 IMPLANT
SUT VIC AB 0 CTB1 27 (SUTURE) ×4 IMPLANT
SUT VIC AB 1 CT1 27 (SUTURE) ×4
SUT VIC AB 1 CT1 27XBRD ANBCTR (SUTURE) ×2 IMPLANT
SUT VIC AB 2-0 CTB1 (SUTURE) ×4 IMPLANT
SYR CONTROL 10ML LL (SYRINGE) IMPLANT
TOWEL OR 17X24 6PK STRL BLUE (TOWEL DISPOSABLE) ×2 IMPLANT
TOWEL OR 17X26 10 PK STRL BLUE (TOWEL DISPOSABLE) ×2 IMPLANT
TRAY FOLEY CATH 16FRSI W/METER (SET/KITS/TRAYS/PACK) ×2 IMPLANT

## 2014-12-07 NOTE — Anesthesia Preprocedure Evaluation (Addendum)
Anesthesia Evaluation  Patient identified by MRN, date of birth, ID band Patient awake    Reviewed: Allergy & Precautions, NPO status , Patient's Chart, lab work & pertinent test results  History of Anesthesia Complications (+) PONV and history of anesthetic complications  Airway Mallampati: I  TM Distance: >3 FB Neck ROM: Full    Dental  (+) Dental Advisory Given   Pulmonary neg pulmonary ROS,  breath sounds clear to auscultation        Cardiovascular hypertension, Pt. on medications - anginaRhythm:Regular Rate:Normal     Neuro/Psych negative neurological ROS     GI/Hepatic negative GI ROS, Neg liver ROS,   Endo/Other  negative endocrine ROS  Renal/GU negative Renal ROS     Musculoskeletal  (+) Arthritis -,   Abdominal   Peds  Hematology negative hematology ROS (+)   Anesthesia Other Findings   Reproductive/Obstetrics                           Anesthesia Physical Anesthesia Plan  ASA: II  Anesthesia Plan: Spinal   Post-op Pain Management:    Induction:   Airway Management Planned: Natural Airway and Simple Face Mask  Additional Equipment:   Intra-op Plan:   Post-operative Plan:   Informed Consent: I have reviewed the patients History and Physical, chart, labs and discussed the procedure including the risks, benefits and alternatives for the proposed anesthesia with the patient or authorized representative who has indicated his/her understanding and acceptance.   Dental advisory given  Plan Discussed with: CRNA and Surgeon  Anesthesia Plan Comments: (Plan routine monitors, SAB)        Anesthesia Quick Evaluation

## 2014-12-07 NOTE — Brief Op Note (Signed)
12/07/2014  1:24 PM  PATIENT:  Mary Barnett  64 y.o. female  PRE-OPERATIVE DIAGNOSIS:  LEFT KNEE DEGENERATIVE JOINT DISEASE WITH RETAINED INTERLOCKING SCREWS STATUS POST ORIF LEFT FEMUR  POST-OPERATIVE DIAGNOSIS:  LEFT KNEE DEGENERATIVE JOINT DISEASE WITH RETAINED INTERLOCKING SCREWS STATUS POST ORIF LEFT FEMUR  PROCEDURE:  Procedure(s): COMPUTER ASSISTED TOTAL KNEE ARTHROPLASTY, removal of hardware from distal femur (Left)  SURGEON:  Surgeon(s) and Role:    * Dorna Leitz, MD - Primary  PHYSICIAN ASSISTANT:   ASSISTANTS: bethune    ANESTHESIA:   epidural and spinal  EBL:  Total I/O In: 1000 [I.V.:1000] Out: 250 [Urine:150; Blood:100]  BLOOD ADMINISTERED:none  DRAINS: (1) Hemovact drain(s) in the l knee with  Suction Open   LOCAL MEDICATIONS USED:  OTHER experel  SPECIMEN:  No Specimen  DISPOSITION OF SPECIMEN:  N/A  COUNTS:  YES  TOURNIQUET:   Total Tourniquet Time Documented: Thigh (Left) - 85 minutes Total: Thigh (Left) - 85 minutes   DICTATION: .Other Dictation: Dictation Number [id number]  PLAN OF CARE: Admit to inpatient   PATIENT DISPOSITION:  PACU - hemodynamically stable.   Delay start of Pharmacological VTE agent (>24hrs) due to surgical blood loss or risk of bleeding: no

## 2014-12-07 NOTE — Progress Notes (Signed)
Orthopedic Tech Progress Note Patient Details:  Mary Barnett November 25, 1950 [id number]  CPM Left Knee CPM Left Knee: On Left Knee Flexion (Degrees): 90 Left Knee Extension (Degrees): 0 Additional Comments: trapeze bar patient helper Viewed order from doctor's order list  [name] 12/07/2014, 2:25 PM

## 2014-12-07 NOTE — Plan of Care (Signed)
Problem: Consults Goal: Diagnosis- Total Joint Replacement Primary Total Knee Left     

## 2014-12-07 NOTE — Progress Notes (Signed)
Orthopedic Tech Progress Note Patient Details:  Mary Barnett 11-30-1950 [id number] On cpm at 7:05 pm Patient ID: [name], female   DOB: 06-12-1950, 64 y.o.   MRN: [id number]   Braulio Bosch 12/07/2014, 7:03 PM

## 2014-12-07 NOTE — Discharge Instructions (Signed)

## 2014-12-07 NOTE — H&P (Signed)
TOTAL KNEE ADMISSION H&P  Patient is being admitted for left total knee arthroplasty.  Subjective:  Chief Complaint:left knee pain.  HPI: Mary Barnett, 63 y.o. female, has a history of pain and functional disability in the left knee due to trauma and arthritis and has failed non-surgical conservative treatments for greater than 12 weeks to includeNSAID's and/or analgesics, corticosteriod injections, viscosupplementation injections, use of assistive devices, weight reduction as appropriate and activity modification.  Onset of symptoms was gradual, starting 5 years ago with rapidlly worsening course since that time. The patient noted IM rod l femur on the left knee(s).  Patient currently rates pain in the left knee(s) at 8 out of 10 with activity. Patient has night pain, worsening of pain with activity and weight bearing, pain that interferes with activities of daily living, pain with passive range of motion, crepitus and joint swelling.  Patient has evidence of subchondral sclerosis, periarticular osteophytes, joint subluxation and joint space narrowing by imaging studies. This patient has had worsening pain after IM rod of femur. There is no active infection.  Patient Active Problem List   Diagnosis Date Noted  . Femur fracture, left 07/15/2013  . Left femoral shaft fracture 07/15/2013   Past Medical History  Diagnosis Date  . Arthritis   . Hypercholesterolemia   . History of kidney stones   . PONV (postoperative nausea and vomiting)   . Hypertension     Past Surgical History  Procedure Laterality Date  . Carpel tunnel surgery      rt hand  . Heel spur surgery      bone spur rt heel  . Knee surgery      left knee  . Abdominal hysterectomy    . Femur im nail Left 07/14/2013    Procedure: INTRAMEDULLARY (IM) NAIL FEMORAL;  Surgeon: [name], MD;  Location: [facility];  Service: Orthopedics;  Laterality: Left;  . Lithotripsy    . Tumor removal      Removed from tailbone  . Knee  arthroscopy Bilateral   . Colonoscopy w/ polypectomy      Prescriptions prior to admission  Medication Sig Dispense Refill Last Dose  . benazepril (LOTENSIN) 40 MG tablet Take 40 mg by mouth daily.     Marland Kitchen BIOTIN PO Take 1 tablet by mouth daily.     . Calcium Citrate-Vitamin D (CALCIUM + D PO) Take 1 tablet by mouth 2 (two) times daily.     . naproxen (NAPROSYN) 500 MG tablet Take 500 mg by mouth 2 (two) times daily with a meal.     . OVER THE COUNTER MEDICATION Take 1 tablet by mouth 2 (two) times daily. Medication: VISCAL (vitamin for hair loss)     . simvastatin (ZOCOR) 20 MG tablet Take 20 mg by mouth every morning.    07/14/2013 at Unknown time  . aspirin EC 325 MG tablet Take 1 tablet (325 mg total) by mouth 2 (two) times daily after a meal. (Patient not taking: Reported on 11/23/2014) 60 tablet 0 Not Taking at Unknown time  . oxyCODONE-acetaminophen (PERCOCET/ROXICET) 5-325 MG per tablet Take 1-2 tablets by mouth every 6 (six) hours as needed for severe pain. (Patient not taking: Reported on 11/23/2014) 60 tablet 0 Not Taking at Unknown time   Allergies  Allergen Reactions  . Cortisone Itching and Other (See Comments)    flushing of face    Social History  Substance Use Topics  . Smoking status: Never Smoker   . Smokeless tobacco:  Never Used  . Alcohol Use: No    No family history on file.   ROS ROS: I have reviewed the patient's review of systems thoroughly and there are no positive responses as relates to the HPI.  Objective:  Physical Exam  Vital signs in last 24 hours: Temp:  [97.7 F (36.5 C)] 97.7 F (36.5 C) (08/12 0905) Pulse Rate:  [93] 93 (08/12 0905) BP: (143)/(74) 143/74 mmHg (08/12 0905) SpO2:  [98 %] 98 % (08/12 0905) Weight:  [188 lb (85.276 kg)] 188 lb (85.276 kg) (08/12 0905) Well-developed well-nourished patient in no acute distress. Alert and oriented x3 HEENT:within normal limits Cardiac: Regular rate and rhythm Pulmonary: Lungs clear to  auscultation Abdomen: Soft and nontender.  Normal active bowel sounds  Musculoskeletal: L knee: painful rom no instability valgus malalignment rom 0-105   Labs: Recent Results (from the past 2160 hour(s))  Surgical pcr screen     Status: None   Collection Time: 11/26/14  8:29 AM  Result Value Ref Range   MRSA, PCR NEGATIVE NEGATIVE   Staphylococcus aureus NEGATIVE NEGATIVE    Comment:        The Xpert SA Assay (FDA approved for NASAL specimens in patients over 15 years of age), is one component of a comprehensive surveillance program.  Test performance has been validated by [facility] for patients greater than or equal to 39 year old. It is not intended to diagnose infection nor to guide or monitor treatment.   Urinalysis, Routine w reflex microscopic (not at [facility])     Status: Abnormal   Collection Time: 11/26/14  8:29 AM  Result Value Ref Range   Color, Urine YELLOW YELLOW   APPearance CLEAR CLEAR   Specific Gravity, Urine 1.031 (H) 1.005 - 1.030   pH 5.0 5.0 - 8.0   Glucose, UA NEGATIVE NEGATIVE mg/dL   Hgb urine dipstick NEGATIVE NEGATIVE   Bilirubin Urine NEGATIVE NEGATIVE   Ketones, ur NEGATIVE NEGATIVE mg/dL   Protein, ur NEGATIVE NEGATIVE mg/dL   Urobilinogen, UA 0.2 0.0 - 1.0 mg/dL   Nitrite NEGATIVE NEGATIVE   Leukocytes, UA SMALL (A) NEGATIVE  Urine microscopic-add on     Status: None   Collection Time: 11/26/14  8:29 AM  Result Value Ref Range   Squamous Epithelial / LPF RARE RARE   WBC, UA 3-6 <3 WBC/hpf   RBC / HPF 0-2 <3 RBC/hpf  APTT     Status: None   Collection Time: 11/26/14  8:30 AM  Result Value Ref Range   aPTT 28 24 - 37 seconds  CBC WITH DIFFERENTIAL     Status: None   Collection Time: 11/26/14  8:30 AM  Result Value Ref Range   WBC 5.6 4.0 - 10.5 K/uL   RBC 4.53 3.87 - 5.11 MIL/uL   Hemoglobin 13.7 12.0 - 15.0 g/dL   HCT 41.5 36.0 - 46.0 %   MCV 91.6 78.0 - 100.0 fL   MCH 30.2 26.0 - 34.0 pg   MCHC 33.0 30.0 - 36.0 g/dL   RDW 12.6  11.5 - 15.5 %   Platelets 209 150 - 400 K/uL   Neutrophils Relative % 51 43 - 77 %   Neutro Abs 2.9 1.7 - 7.7 K/uL   Lymphocytes Relative 40 12 - 46 %   Lymphs Abs 2.2 0.7 - 4.0 K/uL   Monocytes Relative 6 3 - 12 %   Monocytes Absolute 0.3 0.1 - 1.0 K/uL   Eosinophils Relative 2 0 - 5 %  Eosinophils Absolute 0.1 0.0 - 0.7 K/uL   Basophils Relative 1 0 - 1 %   Basophils Absolute 0.1 0.0 - 0.1 K/uL  Comprehensive metabolic panel     Status: Abnormal   Collection Time: 11/26/14  8:30 AM  Result Value Ref Range   Sodium 142 135 - 145 mmol/L   Potassium 4.1 3.5 - 5.1 mmol/L   Chloride 107 101 - 111 mmol/L   CO2 25 22 - 32 mmol/L   Glucose, Bld 116 (H) 65 - 99 mg/dL   BUN 17 6 - 20 mg/dL   Creatinine, Ser 0.79 0.44 - 1.00 mg/dL   Calcium 9.4 8.9 - 10.3 mg/dL   Total Protein 6.5 6.5 - 8.1 g/dL   Albumin 3.7 3.5 - 5.0 g/dL   AST 23 15 - 41 U/L   ALT 18 14 - 54 U/L   Alkaline Phosphatase 44 38 - 126 U/L   Total Bilirubin 0.5 0.3 - 1.2 mg/dL   GFR calc non Af Amer >60 >60 mL/min   GFR calc Af Amer >60 >60 mL/min    Comment: (NOTE) The eGFR has been calculated using the CKD EPI equation. This calculation has not been validated in all clinical situations. eGFR's persistently <60 mL/min signify possible Chronic Kidney Disease.    Anion gap 10 5 - 15  Protime-INR     Status: None   Collection Time: 11/26/14  8:30 AM  Result Value Ref Range   Prothrombin Time 12.9 11.6 - 15.2 seconds   INR 0.95 0.00 - 1.49  Type and screen     Status: None   Collection Time: 11/26/14  8:35 AM  Result Value Ref Range   ABO/RH(D) O POS    Antibody Screen NEG    Sample Expiration 12/10/2014   ABO/Rh     Status: None   Collection Time: 11/26/14  8:35 AM  Result Value Ref Range   ABO/RH(D) O POS      Estimated body mass index is 29.89 kg/(m^2) as calculated from the following:   Height as of this encounter: 5' 6.5" (1.689 m).   Weight as of this encounter: 188 lb (85.276 kg).   Imaging  Review Plain radiographs demonstrate severe degenerative joint disease of the left knee(s). The overall alignment ismild valgus. The bone quality appears to be fair for age and reported activity level.  Assessment/Plan:  End stage arthritis, left knee   The patient history, physical examination, clinical judgment of the provider and imaging studies are consistent with end stage degenerative joint disease of the left knee(s) and total knee arthroplasty is deemed medically necessary. The treatment options including medical management, injection therapy arthroscopy and arthroplasty were discussed at length. The risks and benefits of total knee arthroplasty were presented and reviewed. The risks due to aseptic loosening, infection, stiffness, patella tracking problems, thromboembolic complications and other imponderables were discussed. The patient acknowledged the explanation, agreed to proceed with the plan and consent was signed. Patient is being admitted for inpatient treatment for surgery, pain control, PT, OT, prophylactic antibiotics, VTE prophylaxis, progressive ambulation and ADL's and discharge planning. The patient is planning to be discharged home with home health services

## 2014-12-07 NOTE — Anesthesia Procedure Notes (Signed)
Spinal Patient location during procedure: OR Start time: 12/07/2014 11:07 AM End time: 12/07/2014 11:13 AM Staffing Anesthesiologist: Annye Asa Performed by: anesthesiologist  Preanesthetic Checklist Completed: patient identified, site marked, surgical consent, pre-op evaluation, timeout performed, IV checked, risks and benefits discussed and monitors and equipment checked Spinal Block Patient position: sitting Prep: Betadine, ChloraPrep and site prepped and draped Patient monitoring: heart rate, cardiac monitor, continuous pulse ox and blood pressure Approach: midline Location: L3-4 Injection technique: single-shot Needle Needle type: Quincke  Needle gauge: 25 G Needle length: 9 cm Additional Notes Pt identified in Operating room.  Monitors applied. Working IV access confirmed. Sterile prep, drape lumbar spine.  1% lido local L 3,4.  #25ga into clear CSF, pos asp CSF beginning and end of injection 15mg  Bupivacaine with dextrose.  Patient asymptomatic, VSS, no heme aspirated, tolerated well.  Jenita Seashore, MD

## 2014-12-07 NOTE — Anesthesia Postprocedure Evaluation (Signed)
  Anesthesia Post-op Note  Patient: Mary Barnett  Procedure(s) Performed: Procedure(s): COMPUTER ASSISTED TOTAL KNEE ARTHROPLASTY, removal of hardware from distal femur (Left)  Patient Location: PACU  Anesthesia Type:Spinal  Level of Consciousness: awake, alert , oriented and patient cooperative  Airway and Oxygen Therapy: Patient Spontanous Breathing and Patient connected to nasal cannula oxygen  Post-op Pain: none  Post-op Assessment: Post-op Vital signs reviewed, Patient's Cardiovascular Status Stable, Respiratory Function Stable, Patent Airway, No signs of Nausea or vomiting, Pain level controlled, No headache, No backache, Spinal receding and Patient able to bend at knees LLE Motor Response: Purposeful movement LLE Sensation: Decreased RLE Motor Response: Purposeful movement (bedns knee 75 degrees) RLE Sensation: Decreased L Sensory Level: L2-Upper inner thigh, upper buttock R Sensory Level: L2-Upper inner thigh, upper buttock  Post-op Vital Signs: Reviewed and stable  Last Vitals:  Filed Vitals:   12/07/14 1353  BP: 121/70  Pulse:   Temp: 04.5 C    Complications: No apparent anesthesia complications

## 2014-12-07 NOTE — Transfer of Care (Signed)
Immediate Anesthesia Transfer of Care Note  Patient: Mary Barnett  Procedure(s) Performed: Procedure(s): COMPUTER ASSISTED TOTAL KNEE ARTHROPLASTY, removal of hardware from distal femur (Left)  Patient Location: PACU  Anesthesia Type:MAC and Regional  Level of Consciousness: awake, alert , oriented and patient cooperative  Airway & Oxygen Therapy: Patient Spontanous Breathing  Post-op Assessment: Report given to RN and Post -op Vital signs reviewed and stable  Post vital signs: Reviewed and stable  Last Vitals:  Filed Vitals:   12/07/14 0905  BP: 143/74  Pulse: 93  Temp: 10.9 C    Complications: No apparent anesthesia complications

## 2014-12-08 LAB — BASIC METABOLIC PANEL
ANION GAP: 7 (ref 5–15)
BUN: 9 mg/dL (ref 6–20)
CO2: 25 mmol/L (ref 22–32)
CREATININE: 0.74 mg/dL (ref 0.44–1.00)
Calcium: 8.5 mg/dL — ABNORMAL LOW (ref 8.9–10.3)
Chloride: 105 mmol/L (ref 101–111)
GLUCOSE: 158 mg/dL — AB (ref 65–99)
POTASSIUM: 3.8 mmol/L (ref 3.5–5.1)
Sodium: 137 mmol/L (ref 135–145)

## 2014-12-08 LAB — CBC
HCT: 33.4 % — ABNORMAL LOW (ref 36.0–46.0)
Hemoglobin: 11 g/dL — ABNORMAL LOW (ref 12.0–15.0)
MCH: 30.2 pg (ref 26.0–34.0)
MCHC: 32.9 g/dL (ref 30.0–36.0)
MCV: 91.8 fL (ref 78.0–100.0)
PLATELETS: 284 10*3/uL (ref 150–400)
RBC: 3.64 MIL/uL — ABNORMAL LOW (ref 3.87–5.11)
RDW: 12.6 % (ref 11.5–15.5)
WBC: 12.2 10*3/uL — ABNORMAL HIGH (ref 4.0–10.5)

## 2014-12-08 NOTE — Evaluation (Signed)
Physical Therapy Evaluation Patient Details Name: Mary Barnett MRN: [id number] DOB: 1951-03-17 Today's Date: 12/08/2014   History of Present Illness  Patient with IM Nail s/p fall 06/2013.  She developed Lt genu valgus and Lt knee pain.  S/P Lt TKR this hospitalization  Clinical Impression  Patient with excellent mobility.  She is highly motivated and has good family support.  Will continue to follow patient while on this venue of care to progress mobility.     Follow Up Recommendations Outpatient PT;Supervision - Intermittent    Equipment Recommendations  None recommended by PT    Recommendations for Other Services       Precautions / Restrictions Precautions Precautions: Knee Precaution Booklet Issued: No Precaution Comments: reviewed rest positioning, no pillow under knee Required Braces or Orthoses: Knee Immobilizer - Left Knee Immobilizer - Left: On when out of bed or walking Restrictions Weight Bearing Restrictions: Yes LLE Weight Bearing: Weight bearing as tolerated      Mobility  Bed Mobility Overal bed mobility: Modified Independent             General bed mobility comments: bed flat, no rail  Transfers Overall transfer level: Needs assistance Equipment used: Rolling walker (2 wheeled) Transfers: Sit to/from Stand Sit to Stand: Min guard         General transfer comment: min cues for hand placement  Ambulation/Gait Ambulation/Gait assistance: Min guard Ambulation Distance (Feet): 200 Feet Assistive device: Rolling walker (2 wheeled) Gait Pattern/deviations: Step-through pattern;Trunk flexed     General Gait Details: leans on RW for support, good pace  Stairs            Wheelchair Mobility    Modified Rankin (Stroke Patients Only)       Balance Overall balance assessment: Needs assistance Sitting-balance support: No upper extremity supported;Feet supported Sitting balance-Leahy Scale: Good     Standing balance support:  Bilateral upper extremity supported Standing balance-Leahy Scale: Fair                               Pertinent Vitals/Pain Pain Assessment: 0-10 Pain Score: 2  Pain Location: Lt knee Pain Descriptors / Indicators: Sore Pain Intervention(s): Limited activity within patient's tolerance;Monitored during session    Home Living Family/patient expects to be discharged to:: Private residence Living Arrangements: Spouse/significant other;Children Available Help at Discharge: Family;Available 24 hours/day Type of Home: House Home Access: Stairs to enter Entrance Stairs-Rails: None Entrance Stairs-Number of Steps: 1 Home Layout: One level Home Equipment: Walker - 4 wheels;Cane - single point;Bedside commode Additional Comments: dau to assist full time x 1 week    Prior Function Level of Independence: Independent with assistive device(s)         Comments: used SPC     Hand Dominance   Dominant Hand: Right    Extremity/Trunk Assessment   Upper Extremity Assessment: Overall WFL for tasks assessed           Lower Extremity Assessment: Generalized weakness      Cervical / Trunk Assessment: Normal  Communication   Communication: No difficulties  Cognition Arousal/Alertness: Awake/alert Behavior During Therapy: WFL for tasks assessed/performed Overall Cognitive Status: Within Functional Limits for tasks assessed                      General Comments      Exercises Total Joint Exercises Ankle Circles/Pumps: AROM;Both;10 reps;Supine Quad Sets: AROM;Left;10 reps;Supine Towel Squeeze: AROM;Both;10 reps;Supine  Short Arc Quad: AROM;Left;10 reps;Supine Heel Slides: AAROM;Left;10 reps;Supine Hip ABduction/ADduction: AROM;Left;10 reps;Supine Straight Leg Raises: AAROM;Left;10 reps;Supine Long Arc Quad: AAROM;Left;10 reps;Seated      Assessment/Plan    PT Assessment Patient needs continued PT services  PT Diagnosis Generalized weakness;Acute  pain;Abnormality of gait   PT Problem List Decreased strength;Decreased range of motion;Decreased activity tolerance;Decreased balance;Decreased mobility;Decreased knowledge of precautions;Pain  PT Treatment Interventions DME instruction;Gait training;Stair training;Functional mobility training;Therapeutic activities;Therapeutic exercise;Balance training;Neuromuscular re-education;Patient/family education   PT Goals (Current goals can be found in the Care Plan section) Acute Rehab PT Goals Patient Stated Goal: get back to work at [employer] PT Goal Formulation: With patient/family Time For Goal Achievement: 12/15/14 Potential to Achieve Goals: Good    Frequency 7X/week   Barriers to discharge        Co-evaluation               End of Session Equipment Utilized During Treatment: Gait belt;Left knee immobilizer Activity Tolerance: Patient tolerated treatment well;No increased pain Patient left: in chair;with call bell/phone within reach;with family/visitor present Nurse Communication: Mobility status         Time: 4193-7902 PT Time Calculation (min) (ACUTE ONLY): 26 min   Charges:   PT Evaluation $Initial PT Evaluation Tier I: 1 Procedure     PT G CodesMalka So, PT [phone]  [address] 12/08/2014, 12:29 PM

## 2014-12-08 NOTE — Progress Notes (Signed)
Physical Therapy Treatment Patient Details Name: Mary Barnett MRN: [id number] DOB: March 25, 1951 Today's Date: 12/08/2014    History of Present Illness Patient with IM Nail s/p fall 06/2013.  She developed Lt genu valgus and Lt knee pain.  S/P Lt TKR this hospitalization    PT Comments    Patient progressing well with mobility. Pain slightly incr this pm but mobility not affected. Applied CPM 0-65 degrees. Will continue to follow patient while on this venue of care to progress mobility.  Follow Up Recommendations  Outpatient PT;Supervision - Intermittent     Equipment Recommendations  None recommended by PT    Recommendations for Other Services       Precautions / Restrictions Precautions Precautions: Knee Precaution Booklet Issued: No Precaution Comments: reviewed rest positioning, no pillow under knee Required Braces or Orthoses: Knee Immobilizer - Left Knee Immobilizer - Left: On when out of bed or walking Restrictions Weight Bearing Restrictions: Yes LLE Weight Bearing: Weight bearing as tolerated    Mobility  Bed Mobility Overal bed mobility: Modified Independent             General bed mobility comments: buddy lift to manage Lt leg  Transfers Overall transfer level: Needs assistance Equipment used: Rolling walker (2 wheeled) Transfers: Sit to/from Stand Sit to Stand: Supervision         General transfer comment: min cues for hand placement (supervison toilet transfer)  Ambulation/Gait Ambulation/Gait assistance: Supervision Ambulation Distance (Feet): 200 Feet Assistive device: Rolling walker (2 wheeled) Gait Pattern/deviations: Antalgic;Decreased stance time - right Gait velocity: 15' in 13.51 sec = 1.11 ft/sec Gait velocity interpretation: <1.8 ft/sec, indicative of risk for recurrent falls General Gait Details: reliant on UE support of RW   Stairs            Wheelchair Mobility    Modified Rankin (Stroke Patients Only)        Balance Overall balance assessment: Needs assistance Sitting-balance support: No upper extremity supported;Feet supported Sitting balance-Leahy Scale: Good     Standing balance support: Bilateral upper extremity supported Standing balance-Leahy Scale: Fair                      Cognition Arousal/Alertness: Awake/alert Behavior During Therapy: WFL for tasks assessed/performed Overall Cognitive Status: Within Functional Limits for tasks assessed                      Exercises Total Joint Exercises Towel Squeeze: AROM;Both;10 reps;Supine Short Arc Quad: AROM;Left;10 reps;Supine Heel Slides: AAROM;Left;10 reps;Supine Hip ABduction/ADduction: AROM;Left;10 reps;Supine Straight Leg Raises: AAROM;Left;10 reps;Supine Long Arc Quad: AAROM;Left;10 reps;Seated Knee Flexion: AROM;Left;AAROM;10 reps;Seated Goniometric ROM: 80    General Comments        Pertinent Vitals/Pain Pain Assessment: 0-10 Pain Score: 4  Pain Location: Lt knee Pain Descriptors / Indicators: Aching;Sore;Tiring Pain Intervention(s): Limited activity within patient's tolerance;Monitored during session    Home Living                      Prior Function            PT Goals (current goals can now be found in the care plan section) Acute Rehab PT Goals Patient Stated Goal: regain mobility PT Goal Formulation: With patient Time For Goal Achievement: 12/15/14 Potential to Achieve Goals: Good Progress towards PT goals: Progressing toward goals    Frequency  7X/week    PT Plan Current plan remains appropriate    Co-evaluation  End of Session Equipment Utilized During Treatment: Left knee immobilizer Activity Tolerance: Patient tolerated treatment well;No increased pain Patient left: in bed;in CPM;with call bell/phone within reach;with SCD's reapplied     Time: 3149-7026 PT Time Calculation (min) (ACUTE ONLY): 28 min  Charges:  $Gait Training: 8-22  mins $Therapeutic Exercise: 8-22 mins                    G CodesMalka So, [place] [phone]  Benson 12/08/2014, 4:03 PM

## 2014-12-08 NOTE — Progress Notes (Signed)
PATIENT ID: Mary Barnett  MRN: [id number]  DOB/AGE:  1950/07/26 / 64 y.o.  1 Day Post-Op Procedure(s) (LRB): COMPUTER ASSISTED TOTAL KNEE ARTHROPLASTY, removal of hardware from distal femur (Left)    PROGRESS NOTE Subjective: Patient is alert, oriented, no Nausea, no Vomiting, yes passing gas, no Bowel Movement. Taking PO with small bites. Denies SOB, Chest or Calf Pain. Using Incentive Spirometer, PAS in place. Ambulate WBAT, CPM 0-40 Patient reports pain as mild  .    Objective: Vital signs in last 24 hours: Filed Vitals:   12/07/14 1537 12/07/14 1922 12/08/14 0007 12/08/14 0429  BP: 129/78 107/54 110/66 113/57  Pulse: 103 104 97 96  Temp: 98.8 F (37.1 C) 99.1 F (37.3 C) 98.9 F (37.2 C) 98.9 F (37.2 C)  TempSrc: Oral Oral Oral Oral  Resp: 18 16 16 16   Height: 5\' 6"  (1.676 m)     Weight:      SpO2: 97% 95% 100% 98%      Intake/Output from previous day: I/O last 3 completed shifts: In: 2297 [P.O.:240; I.V.:3100] Out: 1805 [Urine:1250; Drains:455; Blood:100]   Intake/Output this shift:     LABORATORY DATA:  Recent Labs  12/08/14 0507  WBC 12.2*  HGB 11.0*  HCT 33.4*  PLT 284  NA 137  K 3.8  CL 105  CO2 25  BUN 9  CREATININE 0.74  GLUCOSE 158*  CALCIUM 8.5*    Examination: Neurologically intact Neurovascular intact Sensation intact distally Intact pulses distally Dorsiflexion/Plantar flexion intact Incision: dressing C/D/I No cellulitis present Compartment soft}  Blood and plasma separated in drain indicating minimal recent drainage, drain pulled without difficulty.  Assessment:   1 Day Post-Op Procedure(s) (LRB): COMPUTER ASSISTED TOTAL KNEE ARTHROPLASTY, removal of hardware from distal femur (Left) ADDITIONAL DIAGNOSIS: Expected Acute Blood Loss Anemia, Hypertension  Plan: PT/OT WBAT, CPM 5/hrs day until ROM 0-90 degrees, then D/C CPM DVT Prophylaxis:  SCDx72hrs, ASA 325 mg BID x 2 weeks DISCHARGE PLAN: Home DISCHARGE NEEDS: HHPT, HHRN,  CPM, Walker and 3-in-1 comode seat     Myrl Lazarus R 12/08/2014, 8:07 AM

## 2014-12-08 NOTE — Op Note (Signed)
NAMESHENICKA, SUNDERLIN NO.:  [id number]  MEDICAL RECORD NO.:  [id number]  LOCATION:  [id number]                        FACILITY:  [facility]  PHYSICIAN:  [name], M.D.   DATE OF BIRTH:  Oct 12, 1950  DATE OF PROCEDURE:  12/07/2014 DATE OF DISCHARGE:                              OPERATIVE REPORT   PREOPERATIVE DIAGNOSIS:  End-stage degenerative joint disease, left knee, status post femur fracture, which was fixed with an intramedullary rod with some valgus inclination at the fracture site causing increased pressure into the knee.  POSTOPERATIVE DIAGNOSIS:  End-stage degenerative joint disease, left knee, status post femur fracture, which was fixed with an intramedullary rod with some valgus inclination at the fracture site causing increased pressure into the knee.  PRINCIPAL PROCEDURES: 1. Right total knee replacement with a Sigma system, size 3 femur,     size 3 tibia, 10-mm bridging bearing, and a 35-mm all polyethylene     patella. 2. Computer-assisted left total knee because of previous     intramedullary rod fixation for distal femur fracture.  SURGEON:  [name], M.D.  ASSISTANT:  Gary Fleet, P.A.  ANESTHESIA:  General.  BRIEF HISTORY:  Ms. Sandstrom is a 64 year old female with long history of significant complaints of left knee pain.  She unfortunately suffered a femur fracture, which was fixed.  It was initially anatomically reduced, but unfortunately over time, she did develop some valgus at the fracture site and this increased the pressure into the knee and ultimately, she went on to have worsening pain in the knee and so exacerbation of preexisting arthritis, she is ultimately taken to the operating room for left total knee replacement.  Because of the intramedullary rod which is in place, we felt that we needed do a computer-assisted technique as we were not going to able to use an intramedullary guide for the femur and this was chosen to  be used preoperatively.  We initially had some thoughts of taking out the screws and this was going to be an intraoperative decision.  DESCRIPTION OF PROCEDURE:  The patient was taken to the operating room. After adequate level of anesthesia was obtained with spinal anesthetic, the patient was placed supine on the operating table.  Left leg was prepped and draped in usual sterile fashion.  Following this, the leg was exsanguinated.  Blood pressure tourniquet was inflated to 300 mmHg. Following this, a midline incision was made in the subcutaneous tissue down the level of extensor mechanism and a medial parapatellar arthrotomy was undertaken.  Following this, the anterior and posterior cruciates were removed, the medial and lateral meniscus, retropatellar fat pad and synovium in the anterior aspect of the femur.  We at this point, exposed the screws of the distal femoral fracture and unfortunately, they were way posterior on the lateral side, so, really not in the position where I thought I was going to use that hole for these screws of the computer-assisted.  So, at that point, we felt that it would probably better to leave this as opposed to create a new hole. At this point, two 4-mm pins were drilled into the femur and two 4-mm pins were drilled  into the tibia and computer-assisted modules were placed and the registration process was undertaken.  The range of motion stability, computer-assisted, that was about 25 minutes to the surgical procedure.  Once all the registration process was undertaken, the tibia was cut perpendicular to its long axis under computer assistance. Attention was turned to the femur and the femur was cut perpendicular to the anatomic axis under computer assistance.  Once this was completed, spacer block was put in place and 10 spacer blocks goes into place as well.  At this point, attention was turned towards setting the rotational alignment of the femur, this  was done under computer assistance and then 3 blocks placed, the anterior and posterior cuts were made, chamfers and box.  Attention was then turned towards the tibia, which was sized to a 3, it was drilled and keeled and rotational alignment was set.  The trial components were put in place and a trial of 10 and then put through a range of motion.  The computer, at this point, treating perfect gap balance and neutral long alignment.  At this point, the computer modules were removed.  At this time, attention was turned towards the patella, which was cut down to a level of 13 mm and the 35 paddle was chosen and lugs were drilled.  At this point, all trials were removed.  The knee was copiously and thoroughly lavaged with pulsatile lavage irrigation and suctioned dry.  The final components were cemented in place, size 3 femur, size 3 tibia, 10-mm bridging bearing, a trial and a 35 all-poly patella was placed and held with a clamp.  All excess bone cement was removed and cement was allowed to completely harden.  Once this was the case, the knee was copiously and thoroughly irrigated with pulsatile lavage irrigation and suctioned dry. The tourniquet was let down.  All bleeding was controlled with electrocautery.  The final poly was then placed.  We initially tried with a 12.5, it was too tight.  So, went with a 10 poly, which was our initial choice.  Excellent range of motion and stability were achieved. At this point, the medial parapatellar arthrotomy was closed with 1 Vicryl running over a medium Hemovac drain.  Skin was closed with 0 and 2-0 Vicryl and 3-0 Monocryl subcuticular.  Benzoin and Steri-Strips were applied.  Sterile compressive dressing was applied.  The patient was taken to the recovery room, she was noted to be in satisfactory condition.  Estimated blood loss for the procedure was minimal.     [name], M.D.     Corliss Skains  D:  12/07/2014  T:  12/08/2014  Job:   [id number]

## 2014-12-08 NOTE — Care Management Note (Signed)
Case Management Note  Patient Details  Name: [name] MRN: [id number] Date of Birth: 03/14/1951  Subjective/Objective:                    Action/Plan:   Expected Discharge Date:                  Expected Discharge Plan:  [place]  In-House Referral:     Discharge planning Services  CM Consult  Post Acute Care Choice:    Choice offered to:     DME Arranged:    DME Agency:     HH Arranged:    [place] Agency:     Status of Service:  In process, will continue to follow  Medicare Important Message Given:    Date Medicare IM Given:    Medicare IM give by:    Date Additional Medicare IM Given:    Additional Medicare Important Message give by:     If discussed at [place] of Stay Meetings, dates discussed:    Additional Comments: called [name], [place] for workers' comp and left her another message regarding HHPT and provided Sarah, CM, phone # who is covering tomorrow. Met with pt again and made her aware that [name] did not call me back. Informed her that I called [name] again and left her another VM. Encouraged member to contact CM Monday morning to discuss HHPT. She verbalized understanding. She stated that [name] discussed with her HHPT but she is not sure if she arranged it.  [name], RN 12/08/2014, 4:11 PM

## 2014-12-08 NOTE — Care Management Note (Addendum)
Case Management Note  Patient Details  Name: Mary Barnett MRN: [id number] Date of Birth: 02/24/1951  Subjective/Objective:  64 yo F underwent R TKR and computer-assisted L total knee because of previous IM rod fixation for distal femur fx.            Action/Plan: received referral to assist with HH needs, DME, and meds   Expected Discharge Date:   12/09/14               Expected Discharge Plan:  [place]  In-House Referral:     Discharge planning Services  CM Consult  Post Acute Care Choice:    Choice offered to:     DME Arranged:    DME Agency:     HH Arranged:    HH Agency:     Status of Service:  In process, will continue to follow  Medicare Important Message Given:    Date Medicare IM Given:    Medicare IM give by:    Date Additional Medicare IM Given:    Additional Medicare Important Message give by:     If discussed at [place] of Stay Meetings, dates discussed:    Additional Comments: met with pt to discuss discharge needs. She has workers' comp through [place] [employer]. Her CM is [name], phone # [phone]. Pt has a walker with a seat, 3-n-1 BSC, shower chair, and CPM from previous surgery. She is not sure if [name] arranged HHPT. Will contact [name] to discuss HHPT. She used [facility] in the past and this was arranged through workers' comp. She has the support of her daughter. Contacted [name] and left a VM. Will continue to f/u.  [name], RN 12/08/2014, 9:44 AM

## 2014-12-09 LAB — CBC
HEMATOCRIT: 32.1 % — AB (ref 36.0–46.0)
HEMOGLOBIN: 10.4 g/dL — AB (ref 12.0–15.0)
MCH: 30 pg (ref 26.0–34.0)
MCHC: 32.4 g/dL (ref 30.0–36.0)
MCV: 92.5 fL (ref 78.0–100.0)
Platelets: 186 10*3/uL (ref 150–400)
RBC: 3.47 MIL/uL — ABNORMAL LOW (ref 3.87–5.11)
RDW: 12.9 % (ref 11.5–15.5)
WBC: 10.3 10*3/uL (ref 4.0–10.5)

## 2014-12-09 NOTE — Progress Notes (Signed)
PATIENT ID: Mary Barnett  MRN: [id number]  DOB/AGE:  1950/11/08 / 64 y.o.  2 Days Post-Op Procedure(s) (LRB): COMPUTER ASSISTED TOTAL KNEE ARTHROPLASTY, removal of hardware from distal femur (Left)    PROGRESS NOTE Subjective: Patient is alert, oriented, no Nausea, no Vomiting, yes passing gas, no Bowel Movement. Taking PO well. Denies SOB, Chest or Calf Pain. Using Incentive Spirometer, PAS in place. Ambulate WBAT with pt walking 200 ft with therapy, CPM 0-65 Patient reports pain as 7 on 0-10 scale  .    Objective: Vital signs in last 24 hours: Filed Vitals:   12/08/14 0007 12/08/14 0429 12/08/14 1924 12/09/14 0531  BP: 110/66 113/57 129/78 119/72  Pulse: 97 96 107 101  Temp: 98.9 F (37.2 C) 98.9 F (37.2 C) 99.1 F (37.3 C) 98.9 F (37.2 C)  TempSrc: Oral Oral Oral Oral  Resp: 16 16 16 16   Height:      Weight:      SpO2: 100% 98% 96% 98%      Intake/Output from previous day: I/O last 3 completed shifts: In: 1700 [P.O.:600; I.V.:1100] Out: 1100 [Urine:900; Drains:200]   Intake/Output this shift:     LABORATORY DATA:  Recent Labs  12/08/14 0507 12/09/14 0745  WBC 12.2* 10.3  HGB 11.0* 10.4*  HCT 33.4* 32.1*  PLT 284 186  NA 137  --   K 3.8  --   CL 105  --   CO2 25  --   BUN 9  --   CREATININE 0.74  --   GLUCOSE 158*  --   CALCIUM 8.5*  --     Examination: Neurologically intact Neurovascular intact Sensation intact distally Intact pulses distally Dorsiflexion/Plantar flexion intact Incision: dressing C/D/I No cellulitis present Compartment soft}  Assessment:   2 Days Post-Op Procedure(s) (LRB): COMPUTER ASSISTED TOTAL KNEE ARTHROPLASTY, removal of hardware from distal femur (Left) ADDITIONAL DIAGNOSIS: Expected Acute Blood Loss Anemia, Hypertension  Plan: PT/OT WBAT, CPM 5/hrs day until ROM 0-90 degrees, then D/C CPM DVT Prophylaxis:  SCDx72hrs, ASA 325 mg BID x 2 weeks DISCHARGE PLAN: Home DISCHARGE NEEDS: HHPT, HHRN, CPM, Walker and  3-in-1 comode seat     Mary Barnett R 12/09/2014, 9:01 AM

## 2014-12-09 NOTE — Progress Notes (Signed)
Physical Therapy Treatment Patient Details Name: Mary Barnett MRN: [id number] DOB: February 20, 1951 Today's Date: 12/09/2014    History of Present Illness Patient with IM Nail s/p fall 06/2013.  She developed Lt genu valgus and Lt knee pain.  S/P Lt TKR this hospitalization    PT Comments    Pt moving very well.  Ambulated room<>ortho gym & completed step training.  Pt reports she has no other questions or concerns Re: mobility at this time.  Patient safe to D/C from a mobility standpoint based on progression towards goals set on PT eval.    Follow Up Recommendations  Outpatient PT;Supervision - Intermittent     Equipment Recommendations  None recommended by PT    Recommendations for Other Services       Precautions / Restrictions Precautions Precautions: Knee Precaution Comments: reviewed rest positioning, no pillow under knee Required Braces or Orthoses: Knee Immobilizer - Left Knee Immobilizer - Left: On when out of bed or walking Restrictions LLE Weight Bearing: Weight bearing as tolerated    Mobility  Bed Mobility Overal bed mobility: Modified Independent                Transfers Overall transfer level: Needs assistance Equipment used: Rolling walker (2 wheeled) Transfers: Sit to/from Stand Sit to Stand: Modified independent (Device/Increase time)            Ambulation/Gait Ambulation/Gait assistance: Supervision Ambulation Distance (Feet): 200 Feet Assistive device: Rolling walker (2 wheeled) Gait Pattern/deviations: Step-through pattern;Decreased step length - left;Decreased weight shift to right     General Gait Details: cues to decrease reliance of UE's on AD & increase WBing RLE   Stairs Stairs: Yes Stairs assistance: Min guard Stair Management: No rails;With walker Number of Stairs: 1 General stair comments: cues for technique.    Wheelchair Mobility    Modified Rankin (Stroke Patients Only)       Balance                                    Cognition Arousal/Alertness: Awake/alert Behavior During Therapy: WFL for tasks assessed/performed Overall Cognitive Status: Within Functional Limits for tasks assessed                      Exercises      General Comments        Pertinent Vitals/Pain Pain Assessment: 0-10 Pain Score: 3  Pain Location: lt knee Pain Descriptors / Indicators: Discomfort Pain Intervention(s): Monitored during session;Repositioned    Home Living                      Prior Function            PT Goals (current goals can now be found in the care plan section) Acute Rehab PT Goals Patient Stated Goal: regain mobility PT Goal Formulation: With patient Time For Goal Achievement: 12/15/14 Potential to Achieve Goals: Good Progress towards PT goals: Progressing toward goals    Frequency  7X/week    PT Plan Current plan remains appropriate    Co-evaluation             End of Session Equipment Utilized During Treatment: Left knee immobilizer Activity Tolerance: Patient tolerated treatment well Patient left: in chair;with call bell/phone within reach;with family/visitor present     Time: 0920-0936 PT Time Calculation (min) (ACUTE ONLY): 16 min  Charges:  $Gait Training: 8-22  mins                    G Codes:      Sena Hitch 12/09/2014, 12:37 PM   Sarajane Marek, PTA [phone] 12/09/2014

## 2014-12-09 NOTE — Discharge Summary (Signed)
Patient ID: AMNA WELKER MRN: [id number] DOB/AGE: 1950/04/29 64 y.o.  Admit date: 12/07/2014 Discharge date: 12/09/2014  Admission Diagnoses:  Principal Problem:   Post-traumatic osteoarthritis of left knee   Discharge Diagnoses:  Same  Past Medical History  Diagnosis Date  . Arthritis   . Hypercholesterolemia   . History of kidney stones   . PONV (postoperative nausea and vomiting)   . Hypertension     Surgeries: Procedure(s): COMPUTER ASSISTED TOTAL KNEE ARTHROPLASTY, removal of hardware from distal femur on 12/07/2014   Consultants:    Discharged Condition: Improved  Hospital Course: Mary Barnett is an 64 y.o. female who was admitted 12/07/2014 for operative treatment ofPost-traumatic osteoarthritis of left knee. Patient has severe unremitting pain that affects sleep, daily activities, and work/hobbies. After pre-op clearance the patient was taken to the operating room on 12/07/2014 and underwent  Procedure(s): COMPUTER ASSISTED TOTAL KNEE ARTHROPLASTY, removal of hardware from distal femur.    Patient was given perioperative antibiotics: Anti-infectives    Start     Dose/Rate Route Frequency Ordered Stop   12/07/14 1700  ceFAZolin (ANCEF) IVPB 2 g/50 mL premix     2 g 100 mL/hr over 30 Minutes Intravenous Every 6 hours 12/07/14 1619 12/08/14 0459   12/07/14 1030  ceFAZolin (ANCEF) IVPB 2 g/50 mL premix     2 g 100 mL/hr over 30 Minutes Intravenous To [facility] 12/06/14 1340 12/07/14 1103       Patient was given sequential compression devices, early ambulation, and chemoprophylaxis to prevent DVT.  Patient benefited maximally from hospital stay and there were no complications.    Recent vital signs: Patient Vitals for the past 24 hrs:  BP Temp Temp src Pulse Resp SpO2  12/09/14 0531 119/72 mmHg 98.9 F (37.2 C) Oral (!) 101 16 98 %  12/08/14 1924 129/78 mmHg 99.1 F (37.3 C) Oral (!) 107 16 96 %     Recent laboratory studies:  Recent Labs  12/08/14 0507 12/09/14 0745  WBC 12.2* 10.3  HGB 11.0* 10.4*  HCT 33.4* 32.1*  PLT 284 186  NA 137  --   K 3.8  --   CL 105  --   CO2 25  --   BUN 9  --   CREATININE 0.74  --   GLUCOSE 158*  --   CALCIUM 8.5*  --      Discharge Medications:     Medication List    TAKE these medications        aspirin EC 325 MG tablet  Take 1 tablet (325 mg total) by mouth 2 (two) times daily after a meal. Take x 1 month post op to decrease risk of blood clots.     benazepril 40 MG tablet  Commonly known as:  LOTENSIN  Take 40 mg by mouth daily.     BIOTIN PO  Take 1 tablet by mouth daily.     CALCIUM + D PO  Take 1 tablet by mouth 2 (two) times daily.     methocarbamol 750 MG tablet  Commonly known as:  ROBAXIN-750  Take 1 tablet (750 mg total) by mouth every 8 (eight) hours as needed for muscle spasms.     naproxen 500 MG tablet  Commonly known as:  NAPROSYN  Take 500 mg by mouth 2 (two) times daily with a meal.     OVER THE COUNTER MEDICATION  Take 1 tablet by mouth 2 (two) times daily. Medication: VISCAL (vitamin for hair loss)  oxyCODONE-acetaminophen 5-325 MG per tablet  Commonly known as:  PERCOCET/ROXICET  Take 1-2 tablets by mouth every 4 (four) hours as needed for severe pain.     simvastatin 20 MG tablet  Commonly known as:  ZOCOR  Take 20 mg by mouth every morning.        Diagnostic Studies: Dg Chest 2 View  11/26/2014   CLINICAL DATA:  Hypertension.  EXAM: CHEST  2 VIEW  COMPARISON:  07/14/2013.  FINDINGS: Mediastinum and hilar structures are unremarkable. Mild left base subsegmental atelectasis. No pleural effusion or pneumothorax. Heart size normal. Pulmonary vascularity normal. No acute bony abnormality.  IMPRESSION: Mild left base subsegmental atelectasis.   Electronically Signed   By: Marcello Moores  Register   On: 11/26/2014 08:54    Disposition: 06-Home-Health Care Svc      Discharge Instructions    CPM    Complete by:  As directed   Continuous passive  motion machine (CPM):      Use the CPM from 0 to 60  for 5 hours per day.      You may increase by 10 degrees per day.  You may break it up into 2 or 3 sessions per day.      Use CPM for 2 weeks or until you are told to stop.     Call MD / Call 911    Complete by:  As directed   If you experience chest pain or shortness of breath, CALL 911 and be transported to the hospital emergency room.  If you develope a fever above 101 F, pus (white drainage) or increased drainage or redness at the wound, or calf pain, call your surgeon's office.     Change dressing    Complete by:  As directed   Change dressing on 5, then change the dressing daily with sterile 4 x 4 inch gauze dressing and apply TED hose.  You may clean the incision with alcohol prior to redressing.     Constipation Prevention    Complete by:  As directed   Drink plenty of fluids.  Prune juice may be helpful.  You may use a stool softener, such as Colace (over the counter) 100 mg twice a day.  Use MiraLax (over the counter) for constipation as needed.     Diet - low sodium heart healthy    Complete by:  As directed      Driving restrictions    Complete by:  As directed   No driving for 2 weeks     Increase activity slowly as tolerated    Complete by:  As directed      Patient may shower    Complete by:  As directed   You may shower without a dressing once there is no drainage.  Do not wash over the wound.  If drainage remains, cover wound with plastic wrap and then shower.     Weight bearing as tolerated    Complete by:  As directed   Laterality:  left  Extremity:  Lower           Follow-up Information    Follow up with GRAVES,JOHN L, MD. Schedule an appointment as soon as possible for a visit in 2 weeks.   Specialty:  Orthopedic Surgery   Contact information:   [place] [id number] [phone]        Signed: Hardin Negus, Francie Keeling R 12/09/2014, 10:30 AM

## 2014-12-10 ENCOUNTER — Encounter (HOSPITAL_COMMUNITY): Payer: Self-pay | Admitting: Orthopedic Surgery

## 2014-12-10 NOTE — Progress Notes (Signed)
Received call from [place] with [facility], they had received a referral from workers comp for [facility] for patient.She requested the demographic sheet, HHPT order and d/c summary be faxed to [phone]. Faxed requested information and received confirmation.

## 2015-03-19 ENCOUNTER — Other Ambulatory Visit: Payer: Self-pay

## 2015-03-19 DIAGNOSIS — Z1231 Encounter for screening mammogram for malignant neoplasm of breast: Secondary | ICD-10-CM

## 2015-04-03 ENCOUNTER — Ambulatory Visit
Admission: RE | Admit: 2015-04-03 | Discharge: 2015-04-03 | Disposition: A | Discharge status: Home / Self Care | Payer: Managed Care, Other (non HMO) | Source: Ambulatory Visit

## 2015-04-03 DIAGNOSIS — Z1231 Encounter for screening mammogram for malignant neoplasm of breast: Secondary | ICD-10-CM

## 2016-03-30 ENCOUNTER — Other Ambulatory Visit: Payer: Self-pay | Admitting: Family Medicine

## 2016-03-30 DIAGNOSIS — Z1231 Encounter for screening mammogram for malignant neoplasm of breast: Secondary | ICD-10-CM

## 2016-05-05 ENCOUNTER — Ambulatory Visit: Payer: Self-pay

## 2016-05-26 ENCOUNTER — Ambulatory Visit: Payer: Self-pay

## 2016-05-27 ENCOUNTER — Ambulatory Visit
Admission: RE | Admit: 2016-05-27 | Discharge: 2016-05-27 | Disposition: A | Discharge status: Home / Self Care | Payer: Managed Care, Other (non HMO) | Source: Ambulatory Visit | Attending: Family Medicine | Admitting: Family Medicine

## 2016-05-27 DIAGNOSIS — Z1231 Encounter for screening mammogram for malignant neoplasm of breast: Secondary | ICD-10-CM

## 2016-09-17 DIAGNOSIS — Z96651 Presence of right artificial knee joint: Secondary | ICD-10-CM | POA: Insufficient documentation

## 2016-12-04 ENCOUNTER — Other Ambulatory Visit: Payer: Self-pay | Admitting: Urology

## 2016-12-07 ENCOUNTER — Encounter (HOSPITAL_COMMUNITY): Payer: Self-pay | Admitting: *Deleted

## 2016-12-10 ENCOUNTER — Ambulatory Visit (HOSPITAL_COMMUNITY)
Admission: RE | Admit: 2016-12-10 | Discharge: 2016-12-10 | Disposition: A | Discharge status: Home / Self Care | Payer: BLUE CROSS/BLUE SHIELD | Source: Ambulatory Visit | Attending: Urology | Admitting: Urology

## 2016-12-10 ENCOUNTER — Encounter (HOSPITAL_COMMUNITY): Payer: Self-pay | Admitting: *Deleted

## 2016-12-10 ENCOUNTER — Ambulatory Visit (HOSPITAL_COMMUNITY): Payer: BLUE CROSS/BLUE SHIELD

## 2016-12-10 ENCOUNTER — Encounter (HOSPITAL_COMMUNITY)
Admission: RE | Disposition: A | Discharge status: Home / Self Care | Payer: Self-pay | Source: Ambulatory Visit | Attending: Urology

## 2016-12-10 DIAGNOSIS — Z87442 Personal history of urinary calculi: Secondary | ICD-10-CM | POA: Diagnosis not present

## 2016-12-10 DIAGNOSIS — I10 Essential (primary) hypertension: Secondary | ICD-10-CM | POA: Insufficient documentation

## 2016-12-10 DIAGNOSIS — E7800 Pure hypercholesterolemia, unspecified: Secondary | ICD-10-CM | POA: Insufficient documentation

## 2016-12-10 DIAGNOSIS — M1990 Unspecified osteoarthritis, unspecified site: Secondary | ICD-10-CM | POA: Diagnosis not present

## 2016-12-10 DIAGNOSIS — N200 Calculus of kidney: Secondary | ICD-10-CM | POA: Diagnosis not present

## 2016-12-10 DIAGNOSIS — N201 Calculus of ureter: Secondary | ICD-10-CM

## 2016-12-10 HISTORY — PX: EXTRACORPOREAL SHOCK WAVE LITHOTRIPSY: SHX1557

## 2016-12-10 SURGERY — LITHOTRIPSY, ESWL
Anesthesia: LOCAL | Laterality: Left

## 2016-12-10 MED ORDER — ACETAMINOPHEN 325 MG PO TABS: 650.0000 mg | ORAL_TABLET | ORAL | Status: DC | PRN

## 2016-12-10 MED ORDER — MORPHINE SULFATE (PF) 4 MG/ML IV SOLN: 2.0000 mg | INTRAVENOUS | Status: DC | PRN

## 2016-12-10 MED ORDER — SODIUM CHLORIDE 0.9 % IV SOLN
INTRAVENOUS | Status: DC
  Administered 2016-12-10: 10:00:00 via INTRAVENOUS

## 2016-12-10 MED ORDER — DIPHENHYDRAMINE HCL 25 MG PO CAPS
25.0000 mg | ORAL_CAPSULE | ORAL | Status: AC
  Administered 2016-12-10: 25 mg via ORAL
  Filled 2016-12-10: qty 1

## 2016-12-10 MED ORDER — CIPROFLOXACIN HCL 500 MG PO TABS
500.0000 mg | ORAL_TABLET | ORAL | Status: AC
  Administered 2016-12-10: 500 mg via ORAL
  Filled 2016-12-10: qty 1

## 2016-12-10 MED ORDER — ACETAMINOPHEN 650 MG RE SUPP
650.0000 mg | RECTAL | Status: DC | PRN
  Filled 2016-12-10: qty 1

## 2016-12-10 MED ORDER — ONDANSETRON HCL 4 MG PO TABS: 4.0000 mg | ORAL_TABLET | Freq: Four times a day (QID) | ORAL | 1 refills | Status: DC | PRN

## 2016-12-10 MED ORDER — SODIUM CHLORIDE 0.9 % IV SOLN: 250.0000 mL | INTRAVENOUS | Status: DC | PRN

## 2016-12-10 MED ORDER — DIAZEPAM 5 MG PO TABS
10.0000 mg | ORAL_TABLET | ORAL | Status: AC
  Administered 2016-12-10: 10 mg via ORAL
  Filled 2016-12-10: qty 2

## 2016-12-10 MED ORDER — SODIUM CHLORIDE 0.9% FLUSH: 3.0000 mL | Freq: Two times a day (BID) | INTRAVENOUS | Status: DC

## 2016-12-10 MED ORDER — OXYCODONE HCL 5 MG PO TABS: 5.0000 mg | ORAL_TABLET | ORAL | Status: DC | PRN

## 2016-12-10 MED ORDER — SODIUM CHLORIDE 0.9% FLUSH: 3.0000 mL | INTRAVENOUS | Status: DC | PRN

## 2016-12-10 NOTE — H&P (Signed)
CC: I have blood in my urine.  HPI: Mary Barnett is a 66 year-old female established patient who is here for blood in the urine.  She did see the blood in her urine. She has not seen blood clots.   She does not have a burning sensation when she urinates. She is not currently having trouble urinating.   She is having pain.   Her last U/S or CT Scan was 07/04/2014.     CC: I have pain in the flank.  HPI: Pain began with watering hanging plants.    The problem is on the left side. Her pain started about approximately 11/19/2016. The pain is dull. The pain is intermittent. The pain does radiate.   None< makes the pain better. Nothing causes the pain to become worse. She has not been treated with any pain medications.   She has had this same pain previously. She has had kidney stones.     ALLERGIES: Cortisone - Other Reaction, facial flushing    MEDICATIONS: Biotin 2,500 mcg capsule Oral  Calcium Citrate- Vitamin D 315 mg-250 unit tablet Oral  Diclofenac Sodium 75 mg tablet, delayed release Oral  Losartan Potassium 100 mg tablet Oral  Simvastatin 20 mg tablet Oral     GU PSH: ESWL - 2016 Hysterectomy Unilat SO - 2016    NON-GU PSH: Back Surgery (Unspecified) Colonoscopy - 2000 Knee Arthroscopy/surgery Repair/graft Femur Head/neck    GU PMH: Ureteral calculus - 10/15/2015 Dorsalgia, Unspec, Back pain - 09/25/2015 Gross hematuria, Gross hematuria - 09/25/2015 Renal calculus, Right nephrolithiasis - 09/25/2015 Urinary Tract Inf, Unspec site, Suspected urinary tract infection - 09/25/2015 Abdominal Pain Unspec, Right flank pain - 2016 History of urolithiasis, History of renal calculi - 2016    NON-GU PMH: Nausea, Nausea - 09/25/2015 Encounter for general adult medical examination without abnormal findings, Encounter for preventive health examination - 2016 Personal history of other diseases of the circulatory system, History of hypertension - 2016 Personal history of other  diseases of the musculoskeletal system and connective tissue, History of arthritis - 2016 Personal history of other endocrine, nutritional and metabolic disease, History of hypercholesterolemia - 2016    FAMILY HISTORY: Hematuria - Runs In Family Kidney Stones - Runs In Family Lung Cancer - Runs In Family   SOCIAL HISTORY: Marital Status: Married Preferred Language: English; Ethnicity: Not Hispanic Or Latino; Race: White Current Smoking Status: Patient has never smoked.   Tobacco Use Assessment Completed: Used Tobacco in last 30 days? Does not use smokeless tobacco. Does not drink anymore.  Does not use drugs. Drinks 1 caffeinated drink per day. Has had a blood transfusion.    REVIEW OF SYSTEMS:    GU Review Female:   gross hematuria. Patient denies frequent urination, hard to postpone urination, burning /pain with urination, get up at night to urinate, leakage of urine, stream starts and stops, trouble starting your stream, have to strain to urinate, and being pregnant.  Gastrointestinal (Upper):   Patient denies nausea, vomiting, and indigestion/ heartburn.  Gastrointestinal (Lower):   Patient denies diarrhea and constipation.  Constitutional:   Patient denies fever, night sweats, weight loss, and fatigue.  Skin:   Patient denies skin rash/ lesion and itching.  Eyes:   Patient denies blurred vision and double vision.  Ears/ Nose/ Throat:   Patient denies sinus problems and sore throat.  Hematologic/Lymphatic:   Patient denies swollen glands and easy bruising.  Cardiovascular:   Patient denies leg swelling and chest pains.  Respiratory:  Patient denies cough and shortness of breath.  Endocrine:   Patient denies excessive thirst.  Musculoskeletal:   Patient reports back pain. Patient denies joint pain.  Neurological:   Patient denies headaches and dizziness.  Psychologic:   Patient denies depression and anxiety.   VITAL SIGNS:      12/03/2016 10:54 AM  BP 157/81 mmHg  Pulse 79  /min  Temperature 98.1 F / 36.7 C   MULTI-SYSTEM PHYSICAL EXAMINATION:    Constitutional: Well-nourished. No physical deformities. Normally developed. Good grooming.   Respiratory: No labored breathing, no use of accessory muscles.   Cardiovascular: Normal temperature, normal extremity pulses, no swelling, no varicosities.   Skin: No paleness, no jaundice, no cyanosis. No lesion, no ulcer, no rash.   Neurologic / Psychiatric: Oriented to time, oriented to place, oriented to person. No depression, no anxiety, no agitation.   Gastrointestinal: No mass, no tenderness, no rigidity, non obese abdomen.      PAST DATA REVIEWED:  Source Of History:  Patient  Lab Test Review:   Stone Analysis, Hypercalciura Profile  Records Review:   Previous Patient Records  Urine Test Review:   Urinalysis   PROCEDURES:         C.T. Urogram - [id number]      Bilateral renal calculus. No bilateral hydronephrosis noted. Large left UPJ calculus. No distal left ureteral calculus noted  IMPRESSION:  1. Bilateral nephrolithiasis, with a dominant stone at the left  ureteropelvic junction. No significant hydronephrosis.  2. Fat containing ventral wall hernias, as before.  3. Aortic Atherosclerosis (ICD10-I70.0).          KUB - [id number]  A single view of the abdomen is obtained.      Large left UPJ calculus noted (8 mm). Large upper pole right renal calculus noted. No bilateral ureteral calculus noted         Urinalysis w/Scope - 81001 Dipstick Dipstick Cont'd Micro  Specimen: Voided Bilirubin: Neg WBC/hpf: NS (Not Seen)  Color: Amber Ketones: Neg RBC/hpf: 3 - 10/hpf  Appearance: Clear Blood: 3+ Bacteria: NS (Not Seen)  Specific Gravity: 1.010 Protein: Trace Cystals: NS (Not Seen)  pH: 7.5 Urobilinogen: 0.2 Casts: NS (Not Seen)  Glucose: Neg Nitrites: Neg Trichomonas: Not Present    Leukocyte Esterase: Neg Mucous: Not Present      Epithelial Cells: NS (Not Seen)      Yeast: NS (Not Seen)      Sperm: Not  Present    ASSESSMENT:      ICD-10 Details  1 GU:   Gross hematuria - R31.0 Acute - Secondary to left UPJ calculus.   2   Flank Pain - R10.84 Left, Acute - Secondary to left UPJ calculus.   3   Ureteral calculus - N20.1 Left, Acute - Large left UPJ calculus noted. Tamsulosin 0.4 mg 1 po daily. Hydrocodone 5/325 mg 1-2 po Q6 hrs prn. Proceed with elective ESWL.    PLAN:            Medications New Meds: Tamsulosin Hcl 0.4 mg capsule, ext release 24 hr 1 capsule PO Daily   #30  0 Refill(s)  Hydrocodone-Acetaminophen 5 mg-325 mg tablet 1-2 tablet PO Q 6 H PRN   #20  0 Refill(s)            Orders X-Rays: KUB    C.T. Stone Protocol Without Contrast  X-Ray Notes: . History:  Hematuria: Yes/No  Patient to see MD after exam: Yes/No  Previous exam: CT / IVP/  US/ KUB/ None  When:  Where:  Diabetic: Yes/ No  BUN/ Creatinine:  Date of last BUN Creatinine:  Weight in pounds:  Allergy- IV Contrast: Yes/ No  Conflicting diabetic meds: Yes/ No  Diabetic Meds:  Prior Authorization #: [id number] per [employer]          Schedule         Document Letter(s):  Created for Patient: Clinical Summary         Notes:    I went over the procedure of extracorporal shockwave lithotripsy with the patientand her daughter in quite detail. She understands the risk of poor fragmentation resulting in further ureteral intervention. he also understands that possibility of needing additional procedures to clear stones. Finally, we discussed the risks of hematoma. The patient is willing to proceed.

## 2016-12-10 NOTE — Discharge Instructions (Signed)
Lithotripsy, Care After °This sheet gives you information about how to care for yourself after your procedure. Your health care provider may also give you more specific instructions. If you have problems or questions, contact your health care provider. °What can I expect after the procedure? °After the procedure, it is common to have: °· Some blood in your urine. This should only last for a few days. °· Soreness in your back, sides, or upper abdomen for a few days. °· Blotches or bruises on your back where the pressure wave entered the skin. °· Pain, discomfort, or nausea when pieces (fragments) of the kidney stone move through the tube that carries urine from the kidney to the bladder (ureter). Stone fragments may pass soon after the procedure, but they may continue to pass for up to 4-8 weeks. °? If you have severe pain or nausea, contact your health care provider. This may be caused by a large stone that was not broken up, and this may mean that you need more treatment. °· Some pain or discomfort during urination. °· Some pain or discomfort in the lower abdomen or (in men) at the base of the penis. ° °Follow these instructions at home: °Medicines °· Take over-the-counter and prescription medicines only as told by your health care provider. °· If you were prescribed an antibiotic medicine, take it as told by your health care provider. Do not stop taking the antibiotic even if you start to feel better. °· Do not drive for 24 hours if you were given a medicine to help you relax (sedative). °· Do not drive or use heavy machinery while taking prescription pain medicine. °Eating and drinking °· Drink enough water and fluids to keep your urine clear or pale yellow. This helps any remaining pieces of the stone to pass. It can also help prevent new stones from forming. °· Eat plenty of fresh fruits and vegetables. °· Follow instructions from your health care provider about eating and drinking restrictions. You may be  instructed: °? To reduce how much salt (sodium) you eat or drink. Check ingredients and nutrition facts on packaged foods and beverages. °? To reduce how much meat you eat. °· Eat the recommended amount of calcium for your age and gender. Ask your health care provider how much calcium you should have. °General instructions °· Get plenty of rest. °· Most people can resume normal activities 1-2 days after the procedure. Ask your health care provider what activities are safe for you. °· If directed, strain all urine through the strainer that was provided by your health care provider. °? Keep all fragments for your health care provider to see. Any stones that are found may be sent to a medical lab for examination. The stone may be as small as a grain of salt. °· Keep all follow-up visits as told by your health care provider. This is important. °Contact a health care provider if: °· You have pain that is severe or does not get better with medicine. °· You have nausea that is severe or does not go away. °· You have blood in your urine longer than your health care provider told you to expect. °· You have more blood in your urine. °· You have pain during urination that does not go away. °· You urinate more frequently than usual and this does not go away. °· You develop a rash or any other possible signs of an allergic reaction. °Get help right away if: °· You have severe pain in   your back, sides, or upper abdomen.  You have severe pain while urinating.  Your urine is very dark red.  You have blood in your stool (feces).  You cannot pass any urine at all.  You feel a strong urge to urinate after emptying your bladder.  You have a fever or chills.  You develop shortness of breath, difficulty breathing, or chest pain.  You have severe nausea that leads to persistent vomiting.  You faint. Summary  After this procedure, it is common to have some pain, discomfort, or nausea when pieces (fragments) of the  kidney stone move through the tube that carries urine from the kidney to the bladder (ureter). If this pain or nausea is severe, however, you should contact your health care provider.  Most people can resume normal activities 1-2 days after the procedure. Ask your health care provider what activities are safe for you.  Drink enough water and fluids to keep your urine clear or pale yellow. This helps any remaining pieces of the stone to pass, and it can help prevent new stones from forming.  If directed, strain your urine and keep all fragments for your health care provider to see. Fragments or stones may be as small as a grain of salt.  Get help right away if you have severe pain in your back, sides, or upper abdomen or have severe pain while urinating. This information is not intended to replace advice given to you by your health care provider. Make sure you discuss any questions you have with your health care provider.  Please get the pain med script you have on hand filled today.   Document Released: 05/03/2007 Document Revised: 03/04/2016 Document Reviewed: 03/04/2016 Elsevier Interactive Patient Education  2017 [employer].

## 2016-12-10 NOTE — Interval H&P Note (Signed)
History and Physical Interval Note: No change in stone location.  12/10/2016 11:30 AM  Mary Barnett  has presented today for surgery, with the diagnosis of LEFT URETEROPELVIC JUNCTION STONE  The various methods of treatment have been discussed with the patient and family. After consideration of risks, benefits and other options for treatment, the patient has consented to  Procedure(s): LEFT EXTRACORPOREAL SHOCK WAVE LITHOTRIPSY (ESWL) (Left) as a surgical intervention .  The patient's history has been reviewed, patient examined, no change in status, stable for surgery.  I have reviewed the patient's chart and labs.  Questions were answered to the patient's satisfaction.     Gad Aymond J

## 2017-09-10 ENCOUNTER — Encounter: Payer: Self-pay | Admitting: Gastroenterology

## 2018-01-20 DIAGNOSIS — I10 Essential (primary) hypertension: Secondary | ICD-10-CM | POA: Diagnosis not present

## 2018-01-20 DIAGNOSIS — M1990 Unspecified osteoarthritis, unspecified site: Secondary | ICD-10-CM | POA: Diagnosis not present

## 2018-01-20 DIAGNOSIS — Z79899 Other long term (current) drug therapy: Secondary | ICD-10-CM | POA: Diagnosis not present

## 2018-01-20 DIAGNOSIS — E785 Hyperlipidemia, unspecified: Secondary | ICD-10-CM | POA: Diagnosis not present

## 2018-01-20 DIAGNOSIS — E8881 Metabolic syndrome: Secondary | ICD-10-CM | POA: Diagnosis not present

## 2018-01-27 DIAGNOSIS — M1711 Unilateral primary osteoarthritis, right knee: Secondary | ICD-10-CM | POA: Diagnosis not present

## 2018-02-10 ENCOUNTER — Other Ambulatory Visit: Payer: Self-pay | Admitting: Orthopedic Surgery

## 2018-02-14 DIAGNOSIS — Z23 Encounter for immunization: Secondary | ICD-10-CM | POA: Diagnosis not present

## 2018-02-14 NOTE — Patient Instructions (Addendum)
ARRIN ISHLER  02/14/2018   Your procedure is scheduled on: 03-04-18  Report to [facility] Main  Entrance   Report to admitting at     1000 AM    Call this number if you have problems the morning of surgery [phone]    Remember: Do not eat food or drink liquids :After Midnight. BRUSH YOUR TEETH MORNING OF SURGERY AND RINSE YOUR MOUTH OUT, NO CHEWING GUM CANDY OR MINTS.     Take these medicines the morning of surgery with A SIP OF WATER: none                                You may not have any metal on your body including hair pins and              piercings  Do not wear jewelry, make-up, lotions, powders or perfumes, deodorant             Do not wear nail polish.  Do not shave  48 hours prior to surgery.     Do not bring valuables to the hospital. [place].  Contacts, dentures or bridgework may not be worn into surgery.  Leave suitcase in the car. After surgery it may be brought to your room.                Please read over the following fact sheets you were given: _____________________________________________________________________           [facility] - Preparing for Surgery Before surgery, you can play an important role.  Because skin is not sterile, your skin needs to be as free of germs as possible.  You can reduce the number of germs on your skin by washing with CHG (chlorahexidine gluconate) soap before surgery.  CHG is an antiseptic cleaner which kills germs and bonds with the skin to continue killing germs even after washing. Please DO NOT use if you have an allergy to CHG or antibacterial soaps.  If your skin becomes reddened/irritated stop using the CHG and inform your nurse when you arrive at Short Stay. Do not shave (including legs and underarms) for at least 48 hours prior to the first CHG shower.  You may shave your face/neck. Please follow these instructions carefully:  1.  Shower  with CHG Soap the night before surgery and the  morning of Surgery.  2.  If you choose to wash your hair, wash your hair first as usual with your  normal  shampoo.  3.  After you shampoo, rinse your hair and body thoroughly to remove the  shampoo.                           4.  Use CHG as you would any other liquid soap.  You can apply chg directly  to the skin and wash                       Gently with a scrungie or clean washcloth.  5.  Apply the CHG Soap to your body ONLY FROM THE NECK DOWN.   Do not use on face/ open  Wound or open sores. Avoid contact with eyes, ears mouth and genitals (private parts).                       Wash face,  Genitals (private parts) with your normal soap.             6.  Wash thoroughly, paying special attention to the area where your surgery  will be performed.  7.  Thoroughly rinse your body with warm water from the neck down.  8.  DO NOT shower/wash with your normal soap after using and rinsing off  the CHG Soap.                9.  Pat yourself dry with a clean towel.            10.  Wear clean pajamas.            11.  Place clean sheets on your bed the night of your first shower and do not  sleep with pets. Day of Surgery : Do not apply any lotions/deodorants the morning of surgery.  Please wear clean clothes to the hospital/surgery center.  FAILURE TO FOLLOW THESE INSTRUCTIONS MAY RESULT IN THE CANCELLATION OF YOUR SURGERY PATIENT SIGNATURE_________________________________  NURSE SIGNATURE__________________________________  ________________________________________________________________________  WHAT IS A BLOOD TRANSFUSION? Blood Transfusion Information  A transfusion is the replacement of blood or some of its parts. Blood is made up of multiple cells which provide different functions.  Red blood cells carry oxygen and are used for blood loss replacement.  White blood cells fight against infection.  Platelets control  bleeding.  Plasma helps clot blood.  Other blood products are available for specialized needs, such as hemophilia or other clotting disorders. BEFORE THE TRANSFUSION  Who gives blood for transfusions?   Healthy volunteers who are fully evaluated to make sure their blood is safe. This is blood bank blood. Transfusion therapy is the safest it has ever been in the practice of medicine. Before blood is taken from a donor, a complete history is taken to make sure that person has no history of diseases nor engages in risky social behavior (examples are intravenous drug use or sexual activity with multiple partners). The donor's travel history is screened to minimize risk of transmitting infections, such as malaria. The donated blood is tested for signs of infectious diseases, such as HIV and hepatitis. The blood is then tested to be sure it is compatible with you in order to minimize the chance of a transfusion reaction. If you or a relative donates blood, this is often done in anticipation of surgery and is not appropriate for emergency situations. It takes many days to process the donated blood. RISKS AND COMPLICATIONS Although transfusion therapy is very safe and saves many lives, the main dangers of transfusion include:   Getting an infectious disease.  Developing a transfusion reaction. This is an allergic reaction to something in the blood you were given. Every precaution is taken to prevent this. The decision to have a blood transfusion has been considered carefully by your caregiver before blood is given. Blood is not given unless the benefits outweigh the risks. AFTER THE TRANSFUSION  Right after receiving a blood transfusion, you will usually feel much better and more energetic. This is especially true if your red blood cells have gotten low (anemic). The transfusion raises the level of the red blood cells which carry oxygen, and this usually causes an energy increase.  The  nurse  administering the transfusion will monitor you carefully for complications. HOME CARE INSTRUCTIONS  No special instructions are needed after a transfusion. You may find your energy is better. Speak with your caregiver about any limitations on activity for underlying diseases you may have. SEEK MEDICAL CARE IF:   Your condition is not improving after your transfusion.  You develop redness or irritation at the intravenous (IV) site. SEEK IMMEDIATE MEDICAL CARE IF:  Any of the following symptoms occur over the next 12 hours:  Shaking chills.  You have a temperature by mouth above 102 F (38.9 C), not controlled by medicine.  Chest, back, or muscle pain.  People around you feel you are not acting correctly or are confused.  Shortness of breath or difficulty breathing.  Dizziness and fainting.  You get a rash or develop hives.  You have a decrease in urine output.  Your urine turns a dark color or changes to pink, red, or brown. Any of the following symptoms occur over the next 10 days:  You have a temperature by mouth above 102 F (38.9 C), not controlled by medicine.  Shortness of breath.  Weakness after normal activity.  The white part of the eye turns yellow (jaundice).  You have a decrease in the amount of urine or are urinating less often.  Your urine turns a dark color or changes to pink, red, or brown. Document Released: 04/10/2000 Document Revised: 07/06/2011 Document Reviewed: 11/28/2007 ExitCare Patient Information 2014 [place].  _______________________________________________________________________  Incentive Spirometer  An incentive spirometer is a tool that can help keep your lungs clear and active. This tool measures how well you are filling your lungs with each breath. Taking long deep breaths may help reverse or decrease the chance of developing breathing (pulmonary) problems (especially infection) following:  A long period of time when you are  unable to move or be active. BEFORE THE PROCEDURE   If the spirometer includes an indicator to show your best effort, your nurse or respiratory therapist will set it to a desired goal.  If possible, sit up straight or lean slightly forward. Try not to slouch.  Hold the incentive spirometer in an upright position. INSTRUCTIONS FOR USE  1. Sit on the edge of your bed if possible, or sit up as far as you can in bed or on a chair. 2. Hold the incentive spirometer in an upright position. 3. Breathe out normally. 4. Place the mouthpiece in your mouth and seal your lips tightly around it. 5. Breathe in slowly and as deeply as possible, raising the piston or the ball toward the top of the column. 6. Hold your breath for 3-5 seconds or for as long as possible. Allow the piston or ball to fall to the bottom of the column. 7. Remove the mouthpiece from your mouth and breathe out normally. 8. Rest for a few seconds and repeat Steps 1 through 7 at least 10 times every 1-2 hours when you are awake. Take your time and take a few normal breaths between deep breaths. 9. The spirometer may include an indicator to show your best effort. Use the indicator as a goal to work toward during each repetition. 10. After each set of 10 deep breaths, practice coughing to be sure your lungs are clear. If you have an incision (the cut made at the time of surgery), support your incision when coughing by placing a pillow or rolled up towels firmly against it. Once you are able to get  out of bed, walk around indoors and cough well. You may stop using the incentive spirometer when instructed by your caregiver.  RISKS AND COMPLICATIONS  Take your time so you do not get dizzy or light-headed.  If you are in pain, you may need to take or ask for pain medication before doing incentive spirometry. It is harder to take a deep breath if you are having pain. AFTER USE  Rest and breathe slowly and easily.  It can be helpful to  keep track of a log of your progress. Your caregiver can provide you with a simple table to help with this. If you are using the spirometer at home, follow these instructions: [place] IF:   You are having difficultly using the spirometer.  You have trouble using the spirometer as often as instructed.  Your pain medication is not giving enough relief while using the spirometer.  You develop fever of 100.5 F (38.1 C) or higher. SEEK IMMEDIATE MEDICAL CARE IF:   You cough up bloody sputum that had not been present before.  You develop fever of 102 F (38.9 C) or greater.  You develop worsening pain at or near the incision site. MAKE SURE YOU:   Understand these instructions.  Will watch your condition.  Will get help right away if you are not doing well or get worse. Document Released: 08/24/2006 Document Revised: 07/06/2011 Document Reviewed: 10/25/2006 [facility] Patient Information 2014 [place], [place].   ________________________________________________________________________

## 2018-02-21 ENCOUNTER — Other Ambulatory Visit: Payer: Self-pay

## 2018-02-21 ENCOUNTER — Ambulatory Visit (HOSPITAL_COMMUNITY)
Admission: RE | Admit: 2018-02-21 | Discharge: 2018-02-21 | Disposition: A | Discharge status: Home / Self Care | Payer: Medicare Other | Source: Ambulatory Visit | Attending: Orthopedic Surgery | Admitting: Orthopedic Surgery

## 2018-02-21 ENCOUNTER — Encounter (HOSPITAL_COMMUNITY): Payer: Self-pay

## 2018-02-21 ENCOUNTER — Encounter (HOSPITAL_COMMUNITY)
Admission: RE | Admit: 2018-02-21 | Discharge: 2018-02-21 | Disposition: A | Discharge status: Home / Self Care | Payer: Medicare Other | Source: Ambulatory Visit | Attending: Orthopedic Surgery | Admitting: Orthopedic Surgery

## 2018-02-21 DIAGNOSIS — Z01818 Encounter for other preprocedural examination: Secondary | ICD-10-CM

## 2018-02-21 DIAGNOSIS — Z01811 Encounter for preprocedural respiratory examination: Secondary | ICD-10-CM | POA: Diagnosis not present

## 2018-02-21 DIAGNOSIS — M1711 Unilateral primary osteoarthritis, right knee: Secondary | ICD-10-CM | POA: Diagnosis not present

## 2018-02-21 HISTORY — DX: Prediabetes: R73.03

## 2018-02-21 LAB — COMPREHENSIVE METABOLIC PANEL
ALBUMIN: 4 g/dL (ref 3.5–5.0)
ALK PHOS: 47 U/L (ref 38–126)
ALT: 24 U/L (ref 0–44)
AST: 23 U/L (ref 15–41)
Anion gap: 8 (ref 5–15)
BILIRUBIN TOTAL: 0.4 mg/dL (ref 0.3–1.2)
BUN: 18 mg/dL (ref 8–23)
CALCIUM: 9.6 mg/dL (ref 8.9–10.3)
CO2: 27 mmol/L (ref 22–32)
CREATININE: 0.69 mg/dL (ref 0.44–1.00)
Chloride: 106 mmol/L (ref 98–111)
GFR calc Af Amer: >60 mL/min (ref >60)
GFR calc non Af Amer: >60 mL/min (ref >60)
GLUCOSE: 108 mg/dL — AB (ref 70–99)
Potassium: 4.6 mmol/L (ref 3.5–5.1)
SODIUM: 141 mmol/L (ref 135–145)
TOTAL PROTEIN: 7 g/dL (ref 6.5–8.1)

## 2018-02-21 LAB — TYPE AND SCREEN
ABO/RH(D): O POS
Antibody Screen: NEGATIVE

## 2018-02-21 LAB — URINALYSIS, ROUTINE W REFLEX MICROSCOPIC
Bilirubin Urine: NEGATIVE
GLUCOSE, UA: NEGATIVE mg/dL
KETONES UR: 5 mg/dL — AB
Leukocytes, UA: NEGATIVE
NITRITE: NEGATIVE
PH: 5 (ref 5.0–8.0)
Protein, ur: NEGATIVE mg/dL
Specific Gravity, Urine: 1.023 (ref 1.005–1.030)

## 2018-02-21 LAB — CBC WITH DIFFERENTIAL/PLATELET
ABS IMMATURE GRANULOCYTES: 0.02 10*3/uL (ref 0.00–0.07)
BASOS ABS: 0.1 10*3/uL (ref 0.0–0.1)
BASOS PCT: 1 %
EOS ABS: 0.2 10*3/uL (ref 0.0–0.5)
Eosinophils Relative: 3 %
HEMATOCRIT: 41.8 % (ref 36.0–46.0)
HEMOGLOBIN: 13.2 g/dL (ref 12.0–15.0)
IMMATURE GRANULOCYTES: 0 %
LYMPHS ABS: 2.2 10*3/uL (ref 0.7–4.0)
Lymphocytes Relative: 37 %
MCH: 29.1 pg (ref 26.0–34.0)
MCHC: 31.6 g/dL (ref 30.0–36.0)
MCV: 92.1 fL (ref 80.0–100.0)
MONO ABS: 0.4 10*3/uL (ref 0.1–1.0)
Monocytes Relative: 7 %
NEUTROS ABS: 3.1 10*3/uL (ref 1.7–7.7)
Neutrophils Relative %: 52 %
Platelets: 198 10*3/uL (ref 150–400)
RBC: 4.54 MIL/uL (ref 3.87–5.11)
RDW: 12.9 % (ref 11.5–15.5)
WBC: 5.9 10*3/uL (ref 4.0–10.5)
nRBC: 0 % (ref 0.0–0.2)

## 2018-02-21 LAB — APTT: APTT: 28 s (ref 24–36)

## 2018-02-21 LAB — PROTIME-INR
INR: 0.87
Prothrombin Time: 11.7 seconds (ref 11.4–15.2)

## 2018-02-21 LAB — SURGICAL PCR SCREEN
MRSA, PCR: NEGATIVE
Staphylococcus aureus: NEGATIVE

## 2018-02-21 LAB — ABO/RH: ABO/RH(D): O POS

## 2018-02-28 ENCOUNTER — Other Ambulatory Visit: Payer: Self-pay | Admitting: Orthopedic Surgery

## 2018-02-28 NOTE — Care Plan (Signed)
Spoke with patient prior to surgery. Will discharge to home with family. Has all needed equipment except CPM and it will be delivered to home.   Ladell Heads, [place]

## 2018-03-03 MED ORDER — BUPIVACAINE LIPOSOME 1.3 % IJ SUSP
20.0000 mL | Freq: Once | INTRAMUSCULAR | Status: DC
  Filled 2018-03-03: qty 20

## 2018-03-03 NOTE — H&P (Signed)
TOTAL KNEE ADMISSION H&P  Patient is being admitted for right total knee arthroplasty.  Subjective:  Chief Complaint:right knee pain.  HPI: Mary Barnett, 67 y.o. female, has a history of pain and functional disability in the right knee due to arthritis and has failed non-surgical conservative treatments for greater than 12 weeks to includecorticosteriod injections, viscosupplementation injections, flexibility and strengthening excercises, weight reduction as appropriate and activity modification.  Onset of symptoms was gradual, starting 5 years ago with gradually worsening course since that time. The patient noted no past surgery on the right knee(s).  Patient currently rates pain in the right knee(s) at 9 out of 10 with activity. Patient has night pain, worsening of pain with activity and weight bearing, pain that interferes with activities of daily living, pain with passive range of motion and joint swelling.  Patient has evidence of subchondral sclerosis, periarticular osteophytes and joint subluxation by imaging studies. This patient has had failure of all reasonable conservative care. There is no active infection.  Patient Active Problem List   Diagnosis Date Noted  . Post-traumatic osteoarthritis of left knee 12/07/2014  . Femur fracture, left ([place]) 07/15/2013  . Left femoral shaft fracture ([place]) 07/15/2013   Past Medical History:  Diagnosis Date  . Arthritis   . History of kidney stones   . Hypercholesterolemia   . Hypertension   . PONV (postoperative nausea and vomiting)   . Pre-diabetes     Past Surgical History:  Procedure Laterality Date  . ABDOMINAL HYSTERECTOMY    . carpel tunnel surgery     rt hand  . COLONOSCOPY W/ POLYPECTOMY    . EXTRACORPOREAL SHOCK WAVE LITHOTRIPSY Left 12/10/2016   Procedure: LEFT EXTRACORPOREAL SHOCK WAVE LITHOTRIPSY (ESWL);  Surgeon: Irine Seal, MD;  Location: [facility];  Service: Urology;  Laterality: Left;  . FEMUR IM NAIL Left 07/14/2013    Procedure: INTRAMEDULLARY (IM) NAIL FEMORAL;  Surgeon: [name], MD;  Location: [place];  Service: Orthopedics;  Laterality: Left;  . HEEL SPUR SURGERY     bone spur rt heel  . JOINT REPLACEMENT     Right total knee Dr. Berenice Primas 03-04-18  . KNEE ARTHROPLASTY Left 12/07/2014   Procedure: COMPUTER ASSISTED TOTAL KNEE ARTHROPLASTY, removal of hardware from distal femur;  Surgeon: Dorna Leitz, MD;  Location: [place];  Service: Orthopedics;  Laterality: Left;  . KNEE ARTHROSCOPY Bilateral   . KNEE SURGERY     left knee  total  . LITHOTRIPSY    . TUMOR REMOVAL     Removed from tailbone    Current Facility-Administered Medications  Medication Dose Route Frequency Provider Last Rate Last Dose  . [START ON 03/04/2018] bupivacaine liposome (EXPAREL) 1.3 % injection 266 mg  20 mL Other Once Dorna Leitz, MD       Current Outpatient Medications  Medication Sig Dispense Refill Last Dose  . Calcium Citrate-Vitamin D (CALCIUM + D PO) Take 1 tablet by mouth 2 (two) times daily.   11/30/2014  . diclofenac (VOLTAREN) 75 MG EC tablet Take 75 mg by mouth 2 (two) times daily.   12/04/2016  . GLUCOSAMINE-CHONDROITIN PO Take 1 tablet by mouth 2 (two) times daily.     Marland Kitchen losartan (COZAAR) 50 MG tablet Take 50 mg by mouth daily.    12/10/2016 at 0600  . simvastatin (ZOCOR) 20 MG tablet Take 20 mg by mouth every other day.    12/10/2016 at 0600   Allergies  Allergen Reactions  . Cortisone Itching and Other (  See Comments)    flushing of face    Social History   Tobacco Use  . Smoking status: Never Smoker  . Smokeless tobacco: Never Used  Substance Use Topics  . Alcohol use: No    No family history on file.   ROS ROS: I have reviewed the patient's review of systems thoroughly and there are no positive responses as relates to the HPI. Objective:  Physical Exam  Vital signs in last 24 hours:   Well-developed well-nourished patient in no acute distress. Alert and oriented x3 HEENT:within normal  limits Cardiac: Regular rate and rhythm Pulmonary: Lungs clear to auscultation Abdomen: Soft and nontender.  Normal active bowel sounds  Musculoskeletal: (Right knee: Limited range of motion.  Painful range of motion.  No instability.  Trace effusion. Labs: Recent Results (from the past 2160 hour(s))  Urinalysis, Routine w reflex microscopic     Status: Abnormal   Collection Time: 02/21/18  8:30 AM  Result Value Ref Range   Color, Urine AMBER (A) YELLOW    Comment: BIOCHEMICALS MAY BE AFFECTED BY COLOR   APPearance CLEAR CLEAR   Specific Gravity, Urine 1.023 1.005 - 1.030   pH 5.0 5.0 - 8.0   Glucose, UA NEGATIVE NEGATIVE mg/dL   Hgb urine dipstick MODERATE (A) NEGATIVE   Bilirubin Urine NEGATIVE NEGATIVE   Ketones, ur 5 (A) NEGATIVE mg/dL   Protein, ur NEGATIVE NEGATIVE mg/dL   Nitrite NEGATIVE NEGATIVE   Leukocytes, UA NEGATIVE NEGATIVE   RBC / HPF 21-50 0 - 5 RBC/hpf   WBC, UA 0-5 0 - 5 WBC/hpf   Bacteria, UA RARE (A) NONE SEEN   Squamous Epithelial / LPF 0-5 0 - 5   Mucus PRESENT     Comment: Performed at [facility], [place] [address], [place], [place] [address]  APTT     Status: None   Collection Time: 02/21/18  8:44 AM  Result Value Ref Range   aPTT 28 24 - 36 seconds    Comment: Performed at [facility], [place] [address] [place], [place] [address]  CBC WITH DIFFERENTIAL     Status: None   Collection Time: 02/21/18  8:44 AM  Result Value Ref Range   WBC 5.9 4.0 - 10.5 K/uL   RBC 4.54 3.87 - 5.11 MIL/uL   Hemoglobin 13.2 12.0 - 15.0 g/dL   HCT 41.8 36.0 - 46.0 %   MCV 92.1 80.0 - 100.0 fL   MCH 29.1 26.0 - 34.0 pg   MCHC 31.6 30.0 - 36.0 g/dL   RDW 12.9 11.5 - 15.5 %   Platelets 198 150 - 400 K/uL   nRBC 0.0 0.0 - 0.2 %   Neutrophils Relative % 52 %   Neutro Abs 3.1 1.7 - 7.7 K/uL   Lymphocytes Relative 37 %   Lymphs Abs 2.2 0.7 - 4.0 K/uL   Monocytes Relative 7 %   Monocytes Absolute 0.4 0.1 - 1.0 K/uL   Eosinophils  Relative 3 %   Eosinophils Absolute 0.2 0.0 - 0.5 K/uL   Basophils Relative 1 %   Basophils Absolute 0.1 0.0 - 0.1 K/uL   Immature Granulocytes 0 %   Abs Immature Granulocytes 0.02 0.00 - 0.07 K/uL    Comment: Performed at [facility], [place] [address] [place], [place] [address]  Comprehensive metabolic panel     Status: Abnormal   Collection Time: 02/21/18  8:44 AM  Result Value Ref Range   Sodium 141 135 -  145 mmol/L   Potassium 4.6 3.5 - 5.1 mmol/L   Chloride 106 98 - 111 mmol/L   CO2 27 22 - 32 mmol/L   Glucose, Bld 108 (H) 70 - 99 mg/dL   BUN 18 8 - 23 mg/dL   Creatinine, Ser 0.69 0.44 - 1.00 mg/dL   Calcium 9.6 8.9 - 10.3 mg/dL   Total Protein 7.0 6.5 - 8.1 g/dL   Albumin 4.0 3.5 - 5.0 g/dL   AST 23 15 - 41 U/L   ALT 24 0 - 44 U/L   Alkaline Phosphatase 47 38 - 126 U/L   Total Bilirubin 0.4 0.3 - 1.2 mg/dL   GFR calc non Af Amer >60 >60 mL/min   GFR calc Af Amer >60 >60 mL/min    Comment: (NOTE) The eGFR has been calculated using the CKD EPI equation. This calculation has not been validated in all clinical situations. eGFR's persistently <60 mL/min signify possible Chronic Kidney Disease.    Anion gap 8 5 - 15    Comment: Performed at [facility], [place] [address], [place], [place] [address]  Protime-INR     Status: None   Collection Time: 02/21/18  8:44 AM  Result Value Ref Range   Prothrombin Time 11.7 11.4 - 15.2 seconds   INR 0.87     Comment: Performed at [facility], [place] [address], [place], [place] [address]  Type and screen Order type and screen if day of surgery is less than 15 days from draw of preadmission visit or order morning of surgery if day of surgery is greater than 6 days from preadmission visit.     Status: None   Collection Time: 02/21/18  8:44 AM  Result Value Ref Range   ABO/RH(D) O POS    Antibody Screen NEG    Sample Expiration 03/07/2018    Extend sample reason      NO TRANSFUSIONS  OR PREGNANCY IN THE PAST 3 MONTHS Performed at [facility], [place] [address], [place], [place] [address]   Surgical pcr screen     Status: None   Collection Time: 02/21/18  8:44 AM  Result Value Ref Range   MRSA, PCR NEGATIVE NEGATIVE   Staphylococcus aureus NEGATIVE NEGATIVE    Comment: (NOTE) The Xpert SA Assay (FDA approved for NASAL specimens in patients 69 years of age and older), is one component of a comprehensive surveillance program. It is not intended to diagnose infection nor to guide or monitor treatment. Performed at [facility], [place] [address] [place], [place] [address]   ABO/Rh     Status: None   Collection Time: 02/21/18  8:44 AM  Result Value Ref Range   ABO/RH(D)      O POS Performed at [facility], [place] [address], [place], [place] [address]     Estimated body mass index is 28.46 kg/m as calculated from the following:   Height as of 02/21/18: 5' 6.5" (1.689 m).   Weight as of 02/21/18: 81.2 kg.   Imaging Review Plain radiographs demonstrate severe degenerative joint disease of the right knee(s). The overall alignment ismild varus. The bone quality appears to be fair for age and reported activity level.   Preoperative templating of the joint replacement has been completed, documented, and submitted to the Operating Room personnel in order to optimize intra-operative equipment management.   Anticipated LOS equal to or greater than 2 midnights due to - Age 43 and older  with one or more of the following:  - Obesity  - Expected need for hospital services (PT, OT, Nursing) required for safe  discharge  - Anticipated need for postoperative skilled nursing care or inpatient rehab  - Active co-morbidities: ssignificant social issues necessitating 2 day hospital stay OR   - Unanticipated findings during/Post Surgery: significant social issues necessitating a 2 day hospital stay  - Patient is a high risk  of re-admission due to: Barriers to post-acute care (logistical, no family support in home)     Assessment/Plan:  End stage arthritis, right knee   The patient history, physical examination, clinical judgment of the provider and imaging studies are consistent with end stage degenerative joint disease of the right knee(s) and total knee arthroplasty is deemed medically necessary. The treatment options including medical management, injection therapy arthroscopy and arthroplasty were discussed at length. The risks and benefits of total knee arthroplasty were presented and reviewed. The risks due to aseptic loosening, infection, stiffness, patella tracking problems, thromboembolic complications and other imponderables were discussed. The patient acknowledged the explanation, agreed to proceed with the plan and consent was signed. Patient is being admitted for inpatient treatment for surgery, pain control, PT, OT, prophylactic antibiotics, VTE prophylaxis, progressive ambulation and ADL's and discharge planning. The patient is planning to be discharged home with home health services

## 2018-03-04 ENCOUNTER — Ambulatory Visit (HOSPITAL_COMMUNITY): Payer: Medicare Other | Admitting: Certified Registered"

## 2018-03-04 ENCOUNTER — Inpatient Hospital Stay (HOSPITAL_COMMUNITY)
Admission: RE | Admit: 2018-03-04 | Discharge: 2018-03-06 | DRG: 470 | Disposition: A | Discharge status: Home / Self Care | Payer: Medicare Other | Attending: Orthopedic Surgery | Admitting: Orthopedic Surgery

## 2018-03-04 ENCOUNTER — Other Ambulatory Visit: Payer: Self-pay

## 2018-03-04 ENCOUNTER — Encounter (HOSPITAL_COMMUNITY): Payer: Self-pay

## 2018-03-04 ENCOUNTER — Encounter (HOSPITAL_COMMUNITY)
Admission: RE | Disposition: A | Discharge status: Home / Self Care | Payer: Self-pay | Source: Home / Self Care | Attending: Orthopedic Surgery

## 2018-03-04 DIAGNOSIS — Z888 Allergy status to other drugs, medicaments and biological substances status: Secondary | ICD-10-CM

## 2018-03-04 DIAGNOSIS — M1711 Unilateral primary osteoarthritis, right knee: Principal | ICD-10-CM | POA: Diagnosis present

## 2018-03-04 DIAGNOSIS — R7303 Prediabetes: Secondary | ICD-10-CM | POA: Diagnosis not present

## 2018-03-04 DIAGNOSIS — I10 Essential (primary) hypertension: Secondary | ICD-10-CM | POA: Diagnosis present

## 2018-03-04 DIAGNOSIS — Z96652 Presence of left artificial knee joint: Secondary | ICD-10-CM | POA: Diagnosis present

## 2018-03-04 DIAGNOSIS — Z87442 Personal history of urinary calculi: Secondary | ICD-10-CM | POA: Diagnosis not present

## 2018-03-04 DIAGNOSIS — Z90710 Acquired absence of both cervix and uterus: Secondary | ICD-10-CM

## 2018-03-04 DIAGNOSIS — G8918 Other acute postprocedural pain: Secondary | ICD-10-CM | POA: Diagnosis not present

## 2018-03-04 DIAGNOSIS — E7800 Pure hypercholesterolemia, unspecified: Secondary | ICD-10-CM | POA: Diagnosis present

## 2018-03-04 DIAGNOSIS — Z79899 Other long term (current) drug therapy: Secondary | ICD-10-CM

## 2018-03-04 HISTORY — PX: TOTAL KNEE ARTHROPLASTY: SHX125

## 2018-03-04 SURGERY — ARTHROPLASTY, KNEE, TOTAL
Anesthesia: Spinal | Site: Knee | Laterality: Right

## 2018-03-04 MED ORDER — DEXAMETHASONE SODIUM PHOSPHATE 10 MG/ML IJ SOLN
INTRAMUSCULAR | Status: DC | PRN
  Administered 2018-03-04: 10 mg via INTRAVENOUS

## 2018-03-04 MED ORDER — DEXAMETHASONE SODIUM PHOSPHATE 10 MG/ML IJ SOLN
INTRAMUSCULAR | Status: AC
  Filled 2018-03-04: qty 1

## 2018-03-04 MED ORDER — PHENYLEPHRINE 40 MCG/ML (10ML) SYRINGE FOR IV PUSH (FOR BLOOD PRESSURE SUPPORT)
PREFILLED_SYRINGE | INTRAVENOUS | Status: AC
  Filled 2018-03-04: qty 10

## 2018-03-04 MED ORDER — BUPIVACAINE LIPOSOME 1.3 % IJ SUSP: 20.0000 mL | Freq: Once | INTRAMUSCULAR | Status: DC

## 2018-03-04 MED ORDER — ONDANSETRON HCL 4 MG/2ML IJ SOLN
INTRAMUSCULAR | Status: DC | PRN
  Administered 2018-03-04: 4 mg via INTRAVENOUS

## 2018-03-04 MED ORDER — ACETAMINOPHEN 325 MG PO TABS: 325.0000 mg | ORAL_TABLET | Freq: Four times a day (QID) | ORAL | Status: DC | PRN

## 2018-03-04 MED ORDER — GABAPENTIN 300 MG PO CAPS
300.0000 mg | ORAL_CAPSULE | Freq: Two times a day (BID) | ORAL | Status: DC
  Administered 2018-03-04 – 2018-03-06 (×4): 300 mg via ORAL
  Filled 2018-03-04 (×4): qty 1

## 2018-03-04 MED ORDER — CHLORHEXIDINE GLUCONATE 4 % EX LIQD: 60.0000 mL | Freq: Once | CUTANEOUS | Status: DC

## 2018-03-04 MED ORDER — ONDANSETRON HCL 4 MG PO TABS: 4.0000 mg | ORAL_TABLET | Freq: Four times a day (QID) | ORAL | Status: DC | PRN

## 2018-03-04 MED ORDER — ROPIVACAINE HCL 7.5 MG/ML IJ SOLN
INTRAMUSCULAR | Status: DC | PRN
  Administered 2018-03-04: 20 mL via PERINEURAL

## 2018-03-04 MED ORDER — DEXAMETHASONE SODIUM PHOSPHATE 10 MG/ML IJ SOLN
10.0000 mg | Freq: Two times a day (BID) | INTRAMUSCULAR | Status: AC
  Administered 2018-03-04 – 2018-03-05 (×2): 10 mg via INTRAVENOUS
  Filled 2018-03-04 (×2): qty 1

## 2018-03-04 MED ORDER — MIDAZOLAM HCL 2 MG/2ML IJ SOLN
1.0000 mg | INTRAMUSCULAR | Status: DC
  Administered 2018-03-04 (×2): 1 mg via INTRAVENOUS
  Filled 2018-03-04: qty 2

## 2018-03-04 MED ORDER — METHOCARBAMOL 500 MG IVPB - SIMPLE MED
500.0000 mg | Freq: Four times a day (QID) | INTRAVENOUS | Status: DC | PRN
  Filled 2018-03-04 (×2): qty 50

## 2018-03-04 MED ORDER — ASPIRIN EC 325 MG PO TBEC
325.0000 mg | DELAYED_RELEASE_TABLET | Freq: Two times a day (BID) | ORAL | Status: DC
  Administered 2018-03-05 – 2018-03-06 (×3): 325 mg via ORAL
  Filled 2018-03-04 (×3): qty 1

## 2018-03-04 MED ORDER — HYDROMORPHONE HCL 1 MG/ML IJ SOLN: 0.2500 mg | INTRAMUSCULAR | Status: DC | PRN

## 2018-03-04 MED ORDER — TRANEXAMIC ACID-NACL 1000-0.7 MG/100ML-% IV SOLN
1000.0000 mg | INTRAVENOUS | Status: AC
  Administered 2018-03-04: 1000 mg via INTRAVENOUS
  Filled 2018-03-04: qty 100

## 2018-03-04 MED ORDER — OXYCODONE HCL 5 MG PO TABS
5.0000 mg | ORAL_TABLET | ORAL | Status: DC | PRN
  Administered 2018-03-04: 5 mg via ORAL
  Filled 2018-03-04 (×2): qty 1

## 2018-03-04 MED ORDER — TRANEXAMIC ACID-NACL 1000-0.7 MG/100ML-% IV SOLN
1000.0000 mg | Freq: Once | INTRAVENOUS | Status: AC
  Administered 2018-03-04: 1000 mg via INTRAVENOUS
  Filled 2018-03-04: qty 100

## 2018-03-04 MED ORDER — ASPIRIN EC 325 MG PO TBEC: 325.0000 mg | DELAYED_RELEASE_TABLET | Freq: Two times a day (BID) | ORAL | 0 refills | Status: DC

## 2018-03-04 MED ORDER — SODIUM CHLORIDE (PF) 0.9 % IJ SOLN
INTRAMUSCULAR | Status: DC | PRN
  Administered 2018-03-04: 50 mL

## 2018-03-04 MED ORDER — STERILE WATER FOR IRRIGATION IR SOLN
Status: DC | PRN
  Administered 2018-03-04: 2000 mL

## 2018-03-04 MED ORDER — MAGNESIUM CITRATE PO SOLN: 1.0000 | Freq: Once | ORAL | Status: DC | PRN

## 2018-03-04 MED ORDER — LOSARTAN POTASSIUM 50 MG PO TABS
50.0000 mg | ORAL_TABLET | Freq: Every day | ORAL | Status: DC
  Administered 2018-03-05 – 2018-03-06 (×2): 50 mg via ORAL
  Filled 2018-03-04 (×2): qty 1

## 2018-03-04 MED ORDER — METHOCARBAMOL 500 MG PO TABS
500.0000 mg | ORAL_TABLET | Freq: Four times a day (QID) | ORAL | Status: DC | PRN
  Administered 2018-03-04 – 2018-03-06 (×3): 500 mg via ORAL
  Filled 2018-03-04 (×3): qty 1

## 2018-03-04 MED ORDER — CLONIDINE HCL (ANALGESIA) 100 MCG/ML EP SOLN
EPIDURAL | Status: DC | PRN
  Administered 2018-03-04: 50 ug

## 2018-03-04 MED ORDER — ONDANSETRON HCL 4 MG/2ML IJ SOLN: 4.0000 mg | Freq: Four times a day (QID) | INTRAMUSCULAR | Status: DC | PRN

## 2018-03-04 MED ORDER — BUPIVACAINE IN DEXTROSE 0.75-8.25 % IT SOLN
INTRATHECAL | Status: DC | PRN
  Administered 2018-03-04: 1.6 mL via INTRATHECAL

## 2018-03-04 MED ORDER — BUPIVACAINE HCL (PF) 0.5 % IJ SOLN
INTRAMUSCULAR | Status: AC
  Filled 2018-03-04: qty 30

## 2018-03-04 MED ORDER — SODIUM CHLORIDE (PF) 0.9 % IJ SOLN
INTRAMUSCULAR | Status: AC
  Filled 2018-03-04: qty 50

## 2018-03-04 MED ORDER — POLYETHYLENE GLYCOL 3350 17 G PO PACK: 17.0000 g | PACK | Freq: Every day | ORAL | Status: DC | PRN

## 2018-03-04 MED ORDER — SIMVASTATIN 20 MG PO TABS
20.0000 mg | ORAL_TABLET | ORAL | Status: DC
  Administered 2018-03-05: 20 mg via ORAL
  Filled 2018-03-04: qty 1

## 2018-03-04 MED ORDER — ALUM & MAG HYDROXIDE-SIMETH 200-200-20 MG/5ML PO SUSP: 30.0000 mL | ORAL | Status: DC | PRN

## 2018-03-04 MED ORDER — BUPIVACAINE-EPINEPHRINE 0.25% -1:200000 IJ SOLN
INTRAMUSCULAR | Status: DC | PRN
  Administered 2018-03-04: 30 mL

## 2018-03-04 MED ORDER — BUPIVACAINE LIPOSOME 1.3 % IJ SUSP
INTRAMUSCULAR | Status: DC | PRN
  Administered 2018-03-04: 20 mL

## 2018-03-04 MED ORDER — SODIUM CHLORIDE 0.9 % IV SOLN
INTRAVENOUS | Status: DC
  Administered 2018-03-04 – 2018-03-05 (×2): via INTRAVENOUS

## 2018-03-04 MED ORDER — PROMETHAZINE HCL 25 MG/ML IJ SOLN: 6.2500 mg | INTRAMUSCULAR | Status: DC | PRN

## 2018-03-04 MED ORDER — DIPHENHYDRAMINE HCL 12.5 MG/5ML PO ELIX: 12.5000 mg | ORAL_SOLUTION | ORAL | Status: DC | PRN

## 2018-03-04 MED ORDER — HYDROMORPHONE HCL 1 MG/ML IJ SOLN: 0.5000 mg | INTRAMUSCULAR | Status: DC | PRN

## 2018-03-04 MED ORDER — SODIUM CHLORIDE 0.9 % IR SOLN
Status: DC | PRN
  Administered 2018-03-04: 1000 mL

## 2018-03-04 MED ORDER — BISACODYL 5 MG PO TBEC: 5.0000 mg | DELAYED_RELEASE_TABLET | Freq: Every day | ORAL | Status: DC | PRN

## 2018-03-04 MED ORDER — CELECOXIB 200 MG PO CAPS
200.0000 mg | ORAL_CAPSULE | Freq: Two times a day (BID) | ORAL | Status: DC
  Administered 2018-03-04 – 2018-03-06 (×4): 200 mg via ORAL
  Filled 2018-03-04 (×4): qty 1

## 2018-03-04 MED ORDER — CEFAZOLIN SODIUM-DEXTROSE 2-4 GM/100ML-% IV SOLN
2.0000 g | Freq: Four times a day (QID) | INTRAVENOUS | Status: AC
  Administered 2018-03-04 (×2): 2 g via INTRAVENOUS
  Filled 2018-03-04 (×2): qty 100

## 2018-03-04 MED ORDER — OXYCODONE-ACETAMINOPHEN 5-325 MG PO TABS: 1.0000 | ORAL_TABLET | Freq: Four times a day (QID) | ORAL | 0 refills | Status: DC | PRN

## 2018-03-04 MED ORDER — 0.9 % SODIUM CHLORIDE (POUR BTL) OPTIME
TOPICAL | Status: DC | PRN
  Administered 2018-03-04: 1000 mL

## 2018-03-04 MED ORDER — PROPOFOL 10 MG/ML IV BOLUS
INTRAVENOUS | Status: AC
  Filled 2018-03-04: qty 80

## 2018-03-04 MED ORDER — LACTATED RINGERS IV SOLN
INTRAVENOUS | Status: DC
  Administered 2018-03-04 (×2): via INTRAVENOUS

## 2018-03-04 MED ORDER — DOCUSATE SODIUM 100 MG PO CAPS: 100.0000 mg | ORAL_CAPSULE | Freq: Two times a day (BID) | ORAL | 0 refills | Status: DC

## 2018-03-04 MED ORDER — TIZANIDINE HCL 2 MG PO TABS: 2.0000 mg | ORAL_TABLET | Freq: Three times a day (TID) | ORAL | 0 refills | Status: DC | PRN

## 2018-03-04 MED ORDER — FENTANYL CITRATE (PF) 100 MCG/2ML IJ SOLN
50.0000 ug | INTRAMUSCULAR | Status: DC
  Administered 2018-03-04: 50 ug via INTRAVENOUS
  Filled 2018-03-04: qty 2

## 2018-03-04 MED ORDER — BUPIVACAINE-EPINEPHRINE (PF) 0.25% -1:200000 IJ SOLN
INTRAMUSCULAR | Status: AC
  Filled 2018-03-04: qty 30

## 2018-03-04 MED ORDER — DOCUSATE SODIUM 100 MG PO CAPS
100.0000 mg | ORAL_CAPSULE | Freq: Two times a day (BID) | ORAL | Status: DC
  Administered 2018-03-04 – 2018-03-06 (×4): 100 mg via ORAL
  Filled 2018-03-04 (×4): qty 1

## 2018-03-04 MED ORDER — PHENYLEPHRINE 40 MCG/ML (10ML) SYRINGE FOR IV PUSH (FOR BLOOD PRESSURE SUPPORT)
PREFILLED_SYRINGE | INTRAVENOUS | Status: DC | PRN
  Administered 2018-03-04 (×9): 80 ug via INTRAVENOUS

## 2018-03-04 MED ORDER — ONDANSETRON HCL 4 MG/2ML IJ SOLN
INTRAMUSCULAR | Status: AC
  Filled 2018-03-04: qty 2

## 2018-03-04 MED ORDER — PROPOFOL 500 MG/50ML IV EMUL
INTRAVENOUS | Status: DC | PRN
  Administered 2018-03-04: 75 ug/kg/min via INTRAVENOUS

## 2018-03-04 MED ORDER — MIDAZOLAM HCL 2 MG/2ML IJ SOLN
INTRAMUSCULAR | Status: AC
  Filled 2018-03-04: qty 2

## 2018-03-04 MED ORDER — CEFAZOLIN SODIUM-DEXTROSE 2-4 GM/100ML-% IV SOLN
2.0000 g | INTRAVENOUS | Status: AC
  Administered 2018-03-04: 2 g via INTRAVENOUS
  Filled 2018-03-04: qty 100

## 2018-03-04 MED ORDER — FENTANYL CITRATE (PF) 100 MCG/2ML IJ SOLN
INTRAMUSCULAR | Status: AC
  Filled 2018-03-04: qty 2

## 2018-03-04 SURGICAL SUPPLY — 64 items
APL SKNCLS STERI-STRIP NONHPOA (GAUZE/BANDAGES/DRESSINGS) ×1
ATTUNE PSFEM RTSZ5 NARCEM KNEE (Femur) ×1 IMPLANT
ATTUNE PSRP INSR SZ5 8 KNEE (Insert) ×1 IMPLANT
BAG SPEC THK2 15X12 ZIP CLS (MISCELLANEOUS) ×1
BAG ZIPLOCK 12X15 (MISCELLANEOUS) ×2 IMPLANT
BANDAGE ACE 6X5 VEL STRL LF (GAUZE/BANDAGES/DRESSINGS) ×2 IMPLANT
BASE TIBIAL ROT PLAT SZ 5 KNEE (Knees) IMPLANT
BENZOIN TINCTURE PRP APPL 2/3 (GAUZE/BANDAGES/DRESSINGS) ×2 IMPLANT
BLADE OSC SAW 13X1.19X90 (BLADE) IMPLANT
BLADE SAGITTAL 25.0X1.19X90 (BLADE) ×3 IMPLANT
BOOTIES KNEE HIGH SLOAN (MISCELLANEOUS) ×2 IMPLANT
BOWL SMART MIX CTS (DISPOSABLE) ×2 IMPLANT
BSPLAT TIB 5 CMNT ROT PLAT STR (Knees) ×1 IMPLANT
CEMENT HV SMART SET (Cement) ×4 IMPLANT
COVER WAND RF STERILE (DRAPES) IMPLANT
CUFF TOURN SGL QUICK 34 (TOURNIQUET CUFF) ×2
CUFF TRNQT CYL 34X4X40X1 (TOURNIQUET CUFF) ×1 IMPLANT
DECANTER SPIKE VIAL GLASS SM (MISCELLANEOUS) ×1 IMPLANT
DRAPE U-SHAPE 47X51 STRL (DRAPES) ×2 IMPLANT
DRESSING AQUACEL AG SP 3.5X10 (GAUZE/BANDAGES/DRESSINGS) IMPLANT
DRSG AQUACEL AG ADV 3.5X10 (GAUZE/BANDAGES/DRESSINGS) ×2 IMPLANT
DRSG AQUACEL AG SP 3.5X10 (GAUZE/BANDAGES/DRESSINGS) ×2
DURAPREP 26ML APPLICATOR (WOUND CARE) ×2 IMPLANT
ELECT REM PT RETURN 15FT ADLT (MISCELLANEOUS) ×2 IMPLANT
GLOVE BIO SURGEON STRL SZ 6.5 (GLOVE) ×1 IMPLANT
GLOVE BIOGEL PI IND STRL 6.5 (GLOVE) IMPLANT
GLOVE BIOGEL PI IND STRL 7.0 (GLOVE) IMPLANT
GLOVE BIOGEL PI IND STRL 7.5 (GLOVE) IMPLANT
GLOVE BIOGEL PI IND STRL 8 (GLOVE) ×2 IMPLANT
GLOVE BIOGEL PI INDICATOR 6.5 (GLOVE) ×1
GLOVE BIOGEL PI INDICATOR 7.0 (GLOVE) ×3
GLOVE BIOGEL PI INDICATOR 7.5 (GLOVE) ×2
GLOVE BIOGEL PI INDICATOR 8 (GLOVE) ×2
GLOVE ECLIPSE 7.5 STRL STRAW (GLOVE) ×4 IMPLANT
GOWN STRL REUS W/TWL LRG LVL3 (GOWN DISPOSABLE) ×2 IMPLANT
GOWN STRL REUS W/TWL XL LVL3 (GOWN DISPOSABLE) ×4 IMPLANT
HANDPIECE INTERPULSE COAX TIP (DISPOSABLE) ×2
HOLDER FOLEY CATH W/STRAP (MISCELLANEOUS) ×1 IMPLANT
HOOD PEEL AWAY FLYTE STAYCOOL (MISCELLANEOUS) ×6 IMPLANT
IMMOBILIZER KNEE 20 (SOFTGOODS) ×2
IMMOBILIZER KNEE 20 THIGH 36 (SOFTGOODS) ×1 IMPLANT
MANIFOLD NEPTUNE II (INSTRUMENTS) ×2 IMPLANT
NEEDLE HYPO 22GX1.5 SAFETY (NEEDLE) ×2 IMPLANT
OSC SAW BLADE 13X1.19X90 (BLADE) ×2
PACK ICE MAXI GEL EZY WRAP (MISCELLANEOUS) ×2 IMPLANT
PACK TOTAL KNEE CUSTOM (KITS) ×2 IMPLANT
PADDING CAST COTTON 6X4 STRL (CAST SUPPLIES) ×2 IMPLANT
PATELLA MEDIAL ATTUN 35MM KNEE (Knees) ×1 IMPLANT
PIN STEINMAN FIXATION KNEE (PIN) ×1 IMPLANT
PIN THREADED HEADED SIGMA (PIN) ×1 IMPLANT
POSITIONER SURGICAL ARM (MISCELLANEOUS) ×2 IMPLANT
SET HNDPC FAN SPRY TIP SCT (DISPOSABLE) ×1 IMPLANT
STAPLER VISISTAT 35W (STAPLE) IMPLANT
STRIP CLOSURE SKIN 1/2X4 (GAUZE/BANDAGES/DRESSINGS) ×1 IMPLANT
SUT MNCRL AB 3-0 PS2 18 (SUTURE) ×2 IMPLANT
SUT VIC AB 0 CT1 36 (SUTURE) ×2 IMPLANT
SUT VIC AB 1 CT1 36 (SUTURE) ×4 IMPLANT
SUT VIC AB 2-0 CT1 27 (SUTURE) ×4
SUT VIC AB 2-0 CT1 TAPERPNT 27 (SUTURE) ×2 IMPLANT
SYR CONTROL 10ML LL (SYRINGE) ×4 IMPLANT
TIBIAL BASE ROT PLAT SZ 5 KNEE (Knees) ×2 IMPLANT
TRAY FOLEY MTR SLVR 14FR STAT (SET/KITS/TRAYS/PACK) ×1 IMPLANT
WRAP KNEE MAXI GEL POST OP (GAUZE/BANDAGES/DRESSINGS) ×1 IMPLANT
YANKAUER SUCT BULB TIP 10FT TU (MISCELLANEOUS) ×2 IMPLANT

## 2018-03-04 NOTE — Care Plan (Signed)
Ortho Bundle Case Management Note  Patient Details  Name: Mary Barnett MRN: [id number] Date of Birth: Jul 07, 1950   Spoke with patient prior to surgery. Will discharge to home with family. Has all needed equipment except CPM and it will be delivered to home She prefers to go straight to OPPT.   Arranged at [place]  03/08/18 @ 11:00                   DME Arranged:  [facility] DME Agency:  [employer]  HH Arranged:    [facility] Agency:     Additional Comments: Please contact me with any questions of if this plan should need to change.  Ladell Heads,  [place] [facility] Specialist  [phone] 03/04/2018, 9:14 AM

## 2018-03-04 NOTE — Evaluation (Signed)
Physical Therapy Evaluation Patient Details Name: Mary Barnett MRN: [id number] DOB: Sep 18, 1950 Today's Date: 03/04/2018   History of Present Illness  Pt s/p R TKR and with hx of L TKR in 2016  Clinical Impression  Pt s/p R TKR and presents with decreased R LE strength/ROM and post op pain limiting functional mobility.  Pt should progress to dc home with family assist and has OP PT scheduled to start Tuesday 03/08/18    Follow Up Recommendations Follow surgeon's recommendation for DC plan and follow-up therapies    Equipment Recommendations  None recommended by PT    Recommendations for Other Services       Precautions / Restrictions Precautions Precautions: Fall;Knee Restrictions Weight Bearing Restrictions: No      Mobility  Bed Mobility Overal bed mobility: Needs Assistance Bed Mobility: Supine to Sit     Supine to sit: Supervision     General bed mobility comments: min cues for sequence but no physical assist  Transfers Overall transfer level: Needs assistance Equipment used: None;Rolling walker (2 wheeled) Transfers: Sit to/from Stand Sit to Stand: Min assist         General transfer comment: cues for LE management, use of UEs to self assist  Ambulation/Gait Ambulation/Gait assistance: Min assist;Min guard Gait Distance (Feet): 111 Feet Assistive device: Rolling walker (2 wheeled) Gait Pattern/deviations: Step-to pattern;Step-through pattern;Decreased step length - right;Decreased step length - left;Shuffle     General Gait Details: cues for posture, position from RW and initial sequence  Stairs            Wheelchair Mobility    Modified Rankin (Stroke Patients Only)       Balance Overall balance assessment: Mild deficits observed, not formally tested                                           Pertinent Vitals/Pain Pain Assessment: 0-10 Pain Score: 3  Pain Location: R knee Pain Descriptors / Indicators:  Aching;Sore Pain Intervention(s): Limited activity within patient's tolerance;Monitored during session;Premedicated before session;Ice applied    Home Living Family/patient expects to be discharged to:: Private residence Living Arrangements: Spouse/significant other Available Help at Discharge: Family Type of Home: House Home Access: Stairs to enter Entrance Stairs-Rails: None Entrance Stairs-Number of Steps: 1 Home Layout: One level Home Equipment: Environmental consultant - 4 wheels;Cane - single point;Bedside commode Additional Comments: dtr to assist    Prior Function Level of Independence: Independent               Hand Dominance        Extremity/Trunk Assessment   Upper Extremity Assessment Upper Extremity Assessment: Overall WFL for tasks assessed    Lower Extremity Assessment Lower Extremity Assessment: RLE deficits/detail    Cervical / Trunk Assessment Cervical / Trunk Assessment: Normal  Communication   Communication: No difficulties  Cognition Arousal/Alertness: Awake/alert Behavior During Therapy: WFL for tasks assessed/performed Overall Cognitive Status: Within Functional Limits for tasks assessed                                        General Comments      Exercises Total Joint Exercises Ankle Circles/Pumps: AROM;Both;15 reps;Supine   Assessment/Plan    PT Assessment Patient needs continued PT services  PT Problem List Decreased strength;Decreased range of  motion;Decreased activity tolerance;Decreased mobility;Decreased knowledge of use of DME;Pain       PT Treatment Interventions DME instruction;Gait training;Stair training;Functional mobility training;Therapeutic activities;Therapeutic exercise;Patient/family education    PT Goals (Current goals can be found in the Care Plan section)  Acute Rehab PT Goals Patient Stated Goal: Regain IND PT Goal Formulation: With patient Time For Goal Achievement: 03/11/18 Potential to Achieve Goals:  Good    Frequency 7X/week   Barriers to discharge        Co-evaluation               AM-PAC PT "6 Clicks" Daily Activity  Outcome Measure Difficulty turning over in bed (including adjusting bedclothes, sheets and blankets)?: A Little Difficulty moving from lying on back to sitting on the side of the bed? : A Little Difficulty sitting down on and standing up from a chair with arms (e.g., wheelchair, bedside commode, etc,.)?: A Lot Help needed moving to and from a bed to chair (including a wheelchair)?: A Little Help needed walking in hospital room?: A Little Help needed climbing 3-5 steps with a railing? : A Little 6 Click Score: 17    End of Session Equipment Utilized During Treatment: Gait belt Activity Tolerance: Patient tolerated treatment well Patient left: in chair;with call bell/phone within reach;with family/visitor present Nurse Communication: Mobility status PT Visit Diagnosis: Difficulty in walking, not elsewhere classified (R26.2)    Time: 0539-7673 PT Time Calculation (min) (ACUTE ONLY): 15 min   Charges:   PT Evaluation $PT Eval Low Complexity: [place] Pager [phone] Office [phone]   Levern Pitter 03/04/2018, 5:50 PM

## 2018-03-04 NOTE — Progress Notes (Signed)
Assisted Dr. Rose with right, ultrasound guided, adductor canal block. Side rails up, monitors on throughout procedure. See vital signs in flow sheet. Tolerated Procedure well.  

## 2018-03-04 NOTE — Anesthesia Preprocedure Evaluation (Signed)
Anesthesia Evaluation  Patient identified by MRN, date of birth, ID band Patient awake    Reviewed: Allergy & Precautions, NPO status , Patient's Chart, lab work & pertinent test results  History of Anesthesia Complications (+) PONV  Airway Mallampati: II  TM Distance: >3 FB Neck ROM: Full    Dental no notable dental hx.    Pulmonary neg pulmonary ROS,    Pulmonary exam normal breath sounds clear to auscultation       Cardiovascular hypertension, Normal cardiovascular exam Rhythm:Regular Rate:Normal     Neuro/Psych negative neurological ROS  negative psych ROS   GI/Hepatic negative GI ROS, Neg liver ROS,   Endo/Other  negative endocrine ROS  Renal/GU negative Renal ROS  negative genitourinary   Musculoskeletal negative musculoskeletal ROS (+)   Abdominal   Peds negative pediatric ROS (+)  Hematology negative hematology ROS (+)   Anesthesia Other Findings   Reproductive/Obstetrics negative OB ROS                             Anesthesia Physical Anesthesia Plan  ASA: II  Anesthesia Plan: Spinal   Post-op Pain Management:    Induction: Intravenous  PONV Risk Score and Plan: 3 and Ondansetron, Dexamethasone and Treatment may vary due to age or medical condition  Airway Management Planned: Simple Face Mask  Additional Equipment:   Intra-op Plan:   Post-operative Plan:   Informed Consent: I have reviewed the patients History and Physical, chart, labs and discussed the procedure including the risks, benefits and alternatives for the proposed anesthesia with the patient or authorized representative who has indicated his/her understanding and acceptance.   Dental advisory given  Plan Discussed with: CRNA and Surgeon  Anesthesia Plan Comments:         Anesthesia Quick Evaluation

## 2018-03-04 NOTE — Brief Op Note (Signed)
03/04/2018  2:53 PM  PATIENT:  Mary Barnett  67 y.o. female  PRE-OPERATIVE DIAGNOSIS:  OSTEOARTHRTIS RIGHT KNEE  POST-OPERATIVE DIAGNOSIS:  OSTEOARTHRTIS RIGHT KNEE  PROCEDURE:  Procedure(s) with comments: TOTAL KNEE ARTHROPLASTY (Right) - Adductor Block  SURGEON:  Surgeon(s) and Role:    Dorna Leitz, MD - Primary  PHYSICIAN ASSISTANT:   ASSISTANTS: jim bethune   ANESTHESIA:   spinal  EBL:  25 mL   BLOOD ADMINISTERED:none  DRAINS: none   LOCAL MEDICATIONS USED:  MARCAINE    and OTHER experel  SPECIMEN:  No Specimen  DISPOSITION OF SPECIMEN:  N/A  COUNTS:  YES  TOURNIQUET:   Total Tourniquet Time Documented: Thigh (Right) - 50 minutes Total: Thigh (Right) - 50 minutes   DICTATION: .Other Dictation: Dictation Number [id number]  PLAN OF CARE: Admit to inpatient   PATIENT DISPOSITION:  PACU - hemodynamically stable.   Delay start of Pharmacological VTE agent (>24hrs) due to surgical blood loss or risk of bleeding: no

## 2018-03-04 NOTE — Op Note (Signed)
Mary Barnett, Mary Barnett MEDICAL RECORD [id number] ACCOUNT [id number] DATE OF BIRTH:06-13-1950 FACILITY: [facility] LOCATION: [facility] PHYSICIAN:Milferd Ansell L. Shriyans Kuenzi, MD  OPERATIVE REPORT  DATE OF PROCEDURE:  03/04/2018  PREOPERATIVE DIAGNOSIS:  End-stage degenerative joint disease, right knee.  POSTOPERATIVE DIAGNOSIS:  End-stage degenerative joint disease, right knee.  PROCEDURE:  Right total knee replacement with an Attune system, size 5 narrow femur, size 5 tibial tray, size 8 insert, and a 35 mm all polyethylene patella.  SURGEON:  Dorna Leitz, MD  ASSISTANT:  Gaspar Skeeters PA-C, was present for the entire case and assisted with retraction, bone cuts, and closing to minimize OR time.  BRIEF HISTORY:  The patient is a 67 year old female with a long history of significant complaints of right knee pain.  She has been treated conservatively for a prolonged period of time.  She had x-rays showing severe bone-on-bone changes having night  pain and light activity pain.  After failure of all conservative care, she is taken to the operating room for right total knee replacement.  DESCRIPTION OF PROCEDURE:  The patient taken to the operating room after adequate anesthesia was obtained with a spinal anesthetic, placed on the operating table, right leg was prepped and draped in sterile fashion.  Following this, the leg was  exsanguinated, blood pressure tourniquet inflated to 300 mmHg and a midline incision was made, subcutaneous tissue down the extensor mechanism.  Medial parapatellar arthrotomy was undertaken.  Synovium on the anterior aspect of the femur, the medial and  lateral meniscus retropatellar fat pad, and anterior and posterior cruciates are excised.  Attention was then turned to the femur.  An intramedullary pilot hole was drilled and the 5 degree distal valgus inclination cut is made and following this,  attention was turned towards sizing the femur.  It sizes to a 5.  Anterior and posterior cuts  were made chamfers and box.  Attention was then turned to the tibia.  It is cut perpendicular to the long axis.  It was then sized to a 5.  Lugs were drilled  and the rotational alignment was made.  Once this was completed, attention was turned to the patella.  A 35 paddle was chosen.  The patella was cut down to the level of 13 mm.  The paddle was placed and lugs were drilled.  Once this was completed, the  trial patella was put in place.  Knee put through a range of motion, excellent stability and range of motion are achieved.  Attention was then turned towards the removal of trial components.  The knee was copiously and thoroughly irrigated with pulsatile  lavage irrigation and suctioned dry.  Exparel was instilled 10 mL in the posterior capsule, both medially and laterally and then another Exparel and Marcaine with epinephrine, and saline mixture instilled throughout the synovial reflections for  postoperative pain control.  At this time, the knee was copiously irrigated and suctioned dry.  Once this was dried completely, cement is placed and the size 5 tibia was placed and hammered into place.  A size 5 narrow femur was opened and placed, an 8  mm bridging bearing trial was placed and a 35 all poly patella was placed and held with a clamp.  All excess bone cement was removed and cement allowed to completely harden.  Once cement was completely hardened, the tourniquet was let down, all bleeders  controlled with electrocautery.  The trial 8 was removed.  The final displaced medial parapatellar arthrotomy was closed with #1 Vicryl running.  The skin was closed with 0 and 2-0 Vicryl and 3-0 Monocryl subcuticular.  Benzoin, Steri-Strips, dry sterile  compressive dressing was applied.  The patient was taken to recovery was noted to be in satisfactory condition.  Estimated blood loss for the procedure is minimal abduction.  Of note, Gaspar Skeeters was present for the entire case.  He assisted for  visualization,  did some bone cuts and also closed to minimize OR time.  TN/NUANCE  D:03/04/2018 T:03/04/2018 JOB:[id number]

## 2018-03-04 NOTE — Anesthesia Postprocedure Evaluation (Signed)
Anesthesia Post Note  Patient: KRISI AZUA  Procedure(s) Performed: TOTAL KNEE ARTHROPLASTY (Right Knee)     Patient location during evaluation: PACU Anesthesia Type: Spinal Level of consciousness: oriented and awake and alert Pain management: pain level controlled Vital Signs Assessment: post-procedure vital signs reviewed and stable Respiratory status: spontaneous breathing, respiratory function stable and patient connected to nasal cannula oxygen Cardiovascular status: blood pressure returned to baseline and stable Postop Assessment: no headache, no backache and no apparent nausea or vomiting Anesthetic complications: no    Last Vitals:  Vitals:   03/04/18 1437 03/04/18 1455  BP:  (!) 114/95  Pulse: 93 90  Resp:  16  Temp:  36.4 C  SpO2: 100% 100%    Last Pain:  Vitals:   03/04/18 1455  TempSrc: Oral  PainSc:                  Arieal Cuoco L Lonna Rabold

## 2018-03-04 NOTE — Transfer of Care (Signed)
Immediate Anesthesia Transfer of Care Note  Patient: SHELLI PORTILLA  Procedure(s) Performed: TOTAL KNEE ARTHROPLASTY (Right Knee)  Patient Location: PACU  Anesthesia Type:Spinal  Level of Consciousness: awake, alert , oriented and patient cooperative  Airway & Oxygen Therapy: Patient Spontanous Breathing and Patient connected to face mask oxygen  Post-op Assessment: Report given to RN and Post -op Vital signs reviewed and stable  Post vital signs: stable  Last Vitals:  Vitals Value Taken Time  BP    Temp    Pulse 88 03/04/2018  1:21 PM  Resp 14 03/04/2018  1:21 PM  SpO2 100 % 03/04/2018  1:21 PM  Vitals shown include unvalidated device data.  Last Pain:  Vitals:   03/04/18 0940  TempSrc:   PainSc: 0-No pain         Complications: No apparent anesthesia complications

## 2018-03-04 NOTE — Anesthesia Procedure Notes (Addendum)
Spinal  Patient location during procedure: OR Start time: 03/04/2018 11:24 AM End time: 03/04/2018 11:30 AM Staffing Anesthesiologist: Myrtie Soman, MD Performed: anesthesiologist  Preanesthetic Checklist Completed: patient identified, site marked, surgical consent, pre-op evaluation, timeout performed, IV checked, risks and benefits discussed and monitors and equipment checked Spinal Block Patient position: sitting Prep: ChloraPrep Patient monitoring: heart rate, continuous pulse ox and blood pressure Approach: midline Location: L3-4 Injection technique: single-shot Needle Needle type: Sprotte  Needle gauge: 24 G Needle length: 9 cm Additional Notes Expiration date of kit checked and confirmed. Patient tolerated procedure well, without complications.

## 2018-03-04 NOTE — Anesthesia Procedure Notes (Signed)
Anesthesia Procedure Image    

## 2018-03-04 NOTE — Interval H&P Note (Signed)
History and Physical Interval Note:  03/04/2018 9:06 AM  Mary Barnett  has presented today for surgery, with the diagnosis of OSTEOARTHRTIS RIGHT KNEE  The various methods of treatment have been discussed with the patient and family. After consideration of risks, benefits and other options for treatment, the patient has consented to  Procedure(s): TOTAL KNEE ARTHROPLASTY (Right) as a surgical intervention .  The patient's history has been reviewed, patient examined, no change in status, stable for surgery.  I have reviewed the patient's chart and labs.  Questions were answered to the patient's satisfaction.     [name]

## 2018-03-04 NOTE — Discharge Instructions (Signed)

## 2018-03-04 NOTE — Anesthesia Procedure Notes (Signed)
Anesthesia Regional Block: Adductor canal block   Pre-Anesthetic Checklist: ,, timeout performed, Correct Patient, Correct Site, Correct Laterality, Correct Procedure, Correct Position, site marked, Risks and benefits discussed,  Surgical consent,  Pre-op evaluation,  At surgeon's request and post-op pain management  Laterality: Right  Prep: chloraprep       Needles:  Injection technique: Single-shot  Needle Type: Echogenic Needle     Needle Length: 9cm      Additional Needles:   Procedures:,,,, ultrasound used (permanent image in chart),,,,  Narrative:  Start time: 03/04/2018 10:58 AM End time: 03/04/2018 11:03 AM Injection made incrementally with aspirations every 5 mL.  Performed by: Personally  Anesthesiologist: Myrtie Soman, MD  Additional Notes: Patient tolerated the procedure well without complications

## 2018-03-05 LAB — CBC
HCT: 35.7 % — ABNORMAL LOW (ref 36.0–46.0)
Hemoglobin: 11.1 g/dL — ABNORMAL LOW (ref 12.0–15.0)
MCH: 29.8 pg (ref 26.0–34.0)
MCHC: 31.1 g/dL (ref 30.0–36.0)
MCV: 95.7 fL (ref 80.0–100.0)
PLATELETS: 195 10*3/uL (ref 150–400)
RBC: 3.73 MIL/uL — ABNORMAL LOW (ref 3.87–5.11)
RDW: 13.1 % (ref 11.5–15.5)
WBC: 11.6 10*3/uL — ABNORMAL HIGH (ref 4.0–10.5)
nRBC: 0 % (ref 0.0–0.2)

## 2018-03-05 NOTE — Progress Notes (Signed)
Physical Therapy Treatment Patient Details Name: Mary Barnett MRN: [id number] DOB: 1950/10/08 Today's Date: 03/05/2018    History of Present Illness Pt s/p R TKR and with hx of L TKR in 2016    PT Comments    Pt motivated and progressing well with mobility and ROM.  Pt with minimal pain at this time and cautioned to not overdo.   Follow Up Recommendations  Follow surgeon's recommendation for DC plan and follow-up therapies     Equipment Recommendations  None recommended by PT    Recommendations for Other Services       Precautions / Restrictions Precautions Precautions: Fall;Knee Restrictions Weight Bearing Restrictions: No    Mobility  Bed Mobility Overal bed mobility: Needs Assistance Bed Mobility: Supine to Sit     Supine to sit: Supervision     General bed mobility comments: min cues for sequence but no physical assist  Transfers Overall transfer level: Needs assistance Equipment used: None;Rolling walker (2 wheeled) Transfers: Sit to/from Stand Sit to Stand: Min guard         General transfer comment: cues for LE management, use of UEs to self assist  Ambulation/Gait Ambulation/Gait assistance: Min guard Gait Distance (Feet): 200 Feet Assistive device: Rolling walker (2 wheeled) Gait Pattern/deviations: Step-to pattern;Step-through pattern;Decreased step length - right;Decreased step length - left;Shuffle     General Gait Details: cues for posture, position from RW and initial sequence   Stairs             Wheelchair Mobility    Modified Rankin (Stroke Patients Only)       Balance Overall balance assessment: Mild deficits observed, not formally tested                                          Cognition Arousal/Alertness: Awake/alert Behavior During Therapy: WFL for tasks assessed/performed Overall Cognitive Status: Within Functional Limits for tasks assessed                                         Exercises Total Joint Exercises Ankle Circles/Pumps: AROM;Both;15 reps;Supine Quad Sets: AROM;Both;10 reps;Supine Heel Slides: AAROM;Right;15 reps;Supine Straight Leg Raises: AROM;Right;15 reps;Supine Goniometric ROM: AAROM R knee -10-  90    General Comments        Pertinent Vitals/Pain Pain Assessment: 0-10 Pain Score: 3  Pain Location: R knee Pain Descriptors / Indicators: Aching;Sore Pain Intervention(s): Limited activity within patient's tolerance;Monitored during session;Ice applied    Home Living                      Prior Function            PT Goals (current goals can now be found in the care plan section) Acute Rehab PT Goals Patient Stated Goal: Regain IND PT Goal Formulation: With patient Time For Goal Achievement: 03/11/18 Potential to Achieve Goals: Good Progress towards PT goals: Progressing toward goals    Frequency    7X/week      PT Plan Current plan remains appropriate    Co-evaluation              AM-PAC PT "6 Clicks" Daily Activity  Outcome Measure  Difficulty turning over in bed (including adjusting bedclothes, sheets and blankets)?: A Little Difficulty moving from  lying on back to sitting on the side of the bed? : A Little Difficulty sitting down on and standing up from a chair with arms (e.g., wheelchair, bedside commode, etc,.)?: A Little Help needed moving to and from a bed to chair (including a wheelchair)?: A Little Help needed walking in hospital room?: A Little Help needed climbing 3-5 steps with a railing? : A Little 6 Click Score: 18    End of Session Equipment Utilized During Treatment: Gait belt Activity Tolerance: Patient tolerated treatment well Patient left: in chair;with call bell/phone within reach;with family/visitor present Nurse Communication: Mobility status PT Visit Diagnosis: Difficulty in walking, not elsewhere classified (R26.2)     Time: 4765-4650 PT Time Calculation (min) (ACUTE  ONLY): 25 min  Charges:  $Gait Training: 8-22 mins $Therapeutic Exercise: 8-22 mins                     [place] Pager [phone] Office [phone]    Brentin Shin 03/05/2018, 9:33 AM

## 2018-03-05 NOTE — Progress Notes (Signed)
Subjective: 1 Day Post-Op Procedure(s) (LRB): TOTAL KNEE ARTHROPLASTY (Right) Patient reports pain as mild.  Taking by mouth okay.  Foley removed this morning.  Has not voided yet.  Objective: Vital signs in last 24 hours: Temp:  [97.5 F (36.4 C)-98.4 F (36.9 C)] 97.8 F (36.6 C) (11/09 6834) Pulse Rate:  [61-121] 89 (11/09 0608) Resp:  [10-19] 14 (11/09 0608) BP: (106-146)/(63-95) 118/69 (11/09 0608) SpO2:  [96 %-100 %] 97 % (11/09 1962) Weight:  [81.2 kg] 81.2 kg (11/08 0926)  Intake/Output from previous day: 11/08 0701 - 11/09 0700 In: 4027.2 [P.O.:840; I.V.:2887.2; IV Piggyback:300] Out: 2295 [Urine:2270; Blood:25] Intake/Output this shift: No intake/output data recorded.  Recent Labs    03/05/18 0414  HGB 11.1*   Recent Labs    03/05/18 0414  WBC 11.6*  RBC 3.73*  HCT 35.7*  PLT 195   No results for input(s): NA, K, CL, CO2, BUN, CREATININE, GLUCOSE, CALCIUM in the last 72 hours. No results for input(s): LABPT, INR in the last 72 hours.  Right knee exam: Neurovascular intact Sensation intact distally Intact pulses distally Dorsiflexion/Plantar flexion intact Incision: no drainage Compartment soft  Anticipated LOS equal to or greater than 2 midnights due to - Age 5 and older with one or more of the following:  - Obesity  - Expected need for hospital services (PT, OT, Nursing) required for safe  discharge  - Anticipated need for postoperative skilled nursing care or inpatient rehab  - Active co-morbidities: None OR   - Unanticipated findings during/Post Surgery: Slow post-op progression: GI, pain control, mobility  - Patient is a high risk of re-admission due to: None   Assessment/Plan: 1 Day Post-Op Procedure(s) (LRB): TOTAL KNEE ARTHROPLASTY (Right)  Plan: Weight-bear as tolerated on right. Aspirin 325 mg twice daily for DVT prophylaxis x1 month postop. Up with therapy Plan for discharge tomorrow    Erlene Senters 03/05/2018, 8:40 AM

## 2018-03-05 NOTE — Progress Notes (Signed)
Physical Therapy Treatment Patient Details Name: Mary Barnett MRN: [id number] DOB: [birth year] Today's Date: 03/05/2018    History of Present Illness Pt s/p R TKR and with hx of L TKR in 2016    PT Comments    Pt continues to progress well with mobility including ambulating increased distance, negotiating stairs and trialing use of 4wh RW.  Pt eager for dc home tomorrow.   Follow Up Recommendations  Follow surgeon's recommendation for DC plan and follow-up therapies     Equipment Recommendations  None recommended by PT    Recommendations for Other Services       Precautions / Restrictions Precautions Precautions: Fall;Knee Restrictions Weight Bearing Restrictions: No    Mobility  Bed Mobility Overal bed mobility: Needs Assistance Bed Mobility: Sit to Supine       Sit to supine: Supervision      Transfers Overall transfer level: Needs assistance Equipment used: None;Rolling walker (2 wheeled) Transfers: Sit to/from Stand Sit to Stand: Min guard;Supervision         General transfer comment: cues for LE management, use of UEs to self assist  Ambulation/Gait Ambulation/Gait assistance: Min guard;Supervision Gait Distance (Feet): 300 Feet Assistive device: 4-wheeled walker Gait Pattern/deviations: Step-to pattern;Step-through pattern;Decreased step length - right;Decreased step length - left;Shuffle     General Gait Details: cues for posture, position from RW and initial sequence   Stairs Stairs: Yes Stairs assistance: Min assist Stair Management: No rails;Forwards;With walker;Step to pattern Number of Stairs: 2 General stair comments: single step twice with 4wh RW and cues for sequence and walker management   Wheelchair Mobility    Modified Rankin (Stroke Patients Only)       Balance Overall balance assessment: Mild deficits observed, not formally tested                                          Cognition Arousal/Alertness:  Awake/alert Behavior During Therapy: WFL for tasks assessed/performed Overall Cognitive Status: Within Functional Limits for tasks assessed                                        Exercises      General Comments        Pertinent Vitals/Pain Pain Assessment: 0-10 Pain Score: 3  Pain Location: R knee Pain Descriptors / Indicators: Sore Pain Intervention(s): Limited activity within patient's tolerance;Monitored during session;Ice applied    Home Living                      Prior Function            PT Goals (current goals can now be found in the care plan section) Acute Rehab PT Goals Patient Stated Goal: Regain IND PT Goal Formulation: With patient Time For Goal Achievement: 03/11/18 Potential to Achieve Goals: Good Progress towards PT goals: Progressing toward goals    Frequency    7X/week      PT Plan Current plan remains appropriate    Co-evaluation              AM-PAC PT "6 Clicks" Daily Activity  Outcome Measure  Difficulty turning over in bed (including adjusting bedclothes, sheets and blankets)?: A Little Difficulty moving from lying on back to sitting on the side of the bed? :  A Little Difficulty sitting down on and standing up from a chair with arms (e.g., wheelchair, bedside commode, etc,.)?: A Little Help needed moving to and from a bed to chair (including a wheelchair)?: A Little Help needed walking in hospital room?: A Little Help needed climbing 3-5 steps with a railing? : A Little 6 Click Score: 18    End of Session Equipment Utilized During Treatment: Gait belt Activity Tolerance: Patient tolerated treatment well Patient left: with call bell/phone within reach;with family/visitor present;in bed Nurse Communication: Mobility status PT Visit Diagnosis: Difficulty in walking, not elsewhere classified (R26.2)     Time: 1062-6948 PT Time Calculation (min) (ACUTE ONLY): 23 min  Charges:  $Gait Training: 8-22  mins $Therapeutic Activity: 8-22 mins                     [place] Pager [phone] Office [phone]    Mary Barnett 03/05/2018, 4:13 PM

## 2018-03-06 LAB — CBC
HCT: 33 % — ABNORMAL LOW (ref 36.0–46.0)
HEMOGLOBIN: 10.2 g/dL — AB (ref 12.0–15.0)
MCH: 29.5 pg (ref 26.0–34.0)
MCHC: 30.9 g/dL (ref 30.0–36.0)
MCV: 95.4 fL (ref 80.0–100.0)
PLATELETS: 184 10*3/uL (ref 150–400)
RBC: 3.46 MIL/uL — ABNORMAL LOW (ref 3.87–5.11)
RDW: 13.2 % (ref 11.5–15.5)
WBC: 13.4 10*3/uL — ABNORMAL HIGH (ref 4.0–10.5)
nRBC: 0 % (ref 0.0–0.2)

## 2018-03-06 NOTE — Discharge Summary (Signed)
Patient ID: Mary Barnett MRN: [id number] DOB/AGE: [birth year] 65 y.o.  Admit date: 03/04/2018 Discharge date: 03/06/2018  Admission Diagnoses:  Principal Problem:   Primary osteoarthritis of right knee   Discharge Diagnoses:  Same  Past Medical History:  Diagnosis Date  . Arthritis   . History of kidney stones   . Hypercholesterolemia   . Hypertension   . PONV (postoperative nausea and vomiting)   . Pre-diabetes     Surgeries: Procedure(s): Right TOTAL KNEE ARTHROPLASTY on 03/04/2018   Consultants:   Discharged Condition: Improved  Hospital Course: CARLY SABO is an 67 y.o. female who was admitted 03/04/2018 for operative treatment ofPrimary osteoarthritis of right knee. Patient has severe unremitting pain that affects sleep, daily activities, and work/hobbies. After pre-op clearance the patient was taken to the operating room on 03/04/2018 and underwent  Procedure(s): Right TOTAL KNEE ARTHROPLASTY.    Patient was given perioperative antibiotics:  Anti-infectives (From admission, onward)   Start     Dose/Rate Route Frequency Ordered Stop   03/04/18 1800  ceFAZolin (ANCEF) IVPB 2g/100 mL premix     2 g 200 mL/hr over 30 Minutes Intravenous Every 6 hours 03/04/18 1508 03/04/18 2340   03/04/18 0930  ceFAZolin (ANCEF) IVPB 2g/100 mL premix     2 g 200 mL/hr over 30 Minutes Intravenous On call to O.R. 03/04/18 4098 03/04/18 1123       Patient was given sequential compression devices, early ambulation, and chemoprophylaxis to prevent DVT.  Patient benefited maximally from hospital stay and there were no complications.    Recent vital signs:  Patient Vitals for the past 24 hrs:  BP Temp Temp src Pulse Resp SpO2  03/06/18 0529 130/71 98 F (36.7 C) Oral (!) 105 16 98 %  03/05/18 2219 (!) 106/57 97.8 F (36.6 C) Oral 94 16 97 %  03/05/18 1418 126/70 98.5 F (36.9 C) Oral 93 18 100 %  03/05/18 1000 97/77 (!) 97.5 F (36.4 C) Oral (!) 109 16 -     Recent laboratory  studies:  Recent Labs    03/05/18 0414 03/06/18 0410  WBC 11.6* 13.4*  HGB 11.1* 10.2*  HCT 35.7* 33.0*  PLT 195 184     Discharge Medications:   Allergies as of 03/06/2018      Reactions   Cortisone Itching, Other (See Comments)   flushing of face      Medication List    STOP taking these medications   diclofenac 75 MG EC tablet Commonly known as:  VOLTAREN   GLUCOSAMINE-CHONDROITIN PO     TAKE these medications   aspirin EC 325 MG tablet Take 1 tablet (325 mg total) by mouth 2 (two) times daily after a meal. Take x 1 month post op to decrease risk of blood clots.   CALCIUM + D PO Take 1 tablet by mouth 2 (two) times daily.   docusate sodium 100 MG capsule Commonly known as:  COLACE Take 1 capsule (100 mg total) by mouth 2 (two) times daily.   losartan 50 MG tablet Commonly known as:  COZAAR Take 50 mg by mouth daily.   oxyCODONE-acetaminophen 5-325 MG tablet Commonly known as:  PERCOCET/ROXICET Take 1-2 tablets by mouth every 6 (six) hours as needed for severe pain.   simvastatin 20 MG tablet Commonly known as:  ZOCOR Take 20 mg by mouth every other day.   tiZANidine 2 MG tablet Commonly known as:  ZANAFLEX Take 1 tablet (2 mg total) by mouth every 8 (  eight) hours as needed for muscle spasms.            Discharge Care Instructions  (From admission, onward)         Start     Ordered   03/06/18 0000  Weight bearing as tolerated    Question Answer Comment  Laterality right   Extremity Lower      03/06/18 0946          Diagnostic Studies: Dg Chest 2 View  Result Date: 02/21/2018 CLINICAL DATA:  Preop for knee surgery. EXAM: CHEST - 2 VIEW COMPARISON:  Two-view chest x-ray 11/26/2014 FINDINGS: The heart size is normal. Lungs are clear. No edema or effusion is present. Rightward curvature of the lower thoracic spine is stable. IMPRESSION: No active cardiopulmonary disease. Electronically Signed   By: San Morelle M.D.   On:  02/21/2018 13:56    Disposition: Discharge disposition: 01-Home or Self Care       Discharge Instructions    Call MD / Call 911   Complete by:  As directed    If you experience chest pain or shortness of breath, CALL 911 and be transported to the hospital emergency room.  If you develope a fever above 101 F, pus (white drainage) or increased drainage or redness at the wound, or calf pain, call your surgeon's office.   Diet general   Complete by:  As directed    Do not put a pillow under the knee. Place it under the heel.   Complete by:  As directed    Face-to-face encounter (required for Medicare/Medicaid patients)   Complete by:  As directed    I Erlene Senters certify that this patient is under my care and that I, or a nurse practitioner or physician's assistant working with me, had a face-to-face encounter that meets the physician face-to-face encounter requirements with this patient on 03/06/2018. The encounter with the patient was in whole, or in part for the following medical condition(s) which is the primary reason for home health care (List medical condition): Status post right total knee replacement   The encounter with the patient was in whole, or in part, for the following medical condition, which is the primary reason for home health care:  Status post right total knee replacement   I certify that, based on my findings, the following services are medically necessary home health services:  Physical therapy   Reason for Medically Necessary Home Health Services:  Therapy- Personnel officer, Public librarian   My clinical findings support the need for the above services:  Pain interferes with ambulation/mobility   Further, I certify that my clinical findings support that this patient is homebound due to:  Pain interferes with ambulation/mobility   Home Health   Complete by:  As directed    To provide the following care/treatments:  PT   Increase activity slowly  as tolerated   Complete by:  As directed    Weight bearing as tolerated   Complete by:  As directed    Laterality:  right   Extremity:  Lower      Follow-up Information    Dorna Leitz, MD. Go on 03/21/2018.   Specialty:  Orthopedic Surgery Why:  Your appointment has been made for 10:00 Contact information: [place] [address] [phone]        Rehabilitation, [place]. Go on 03/08/2018.   Why:  You are scheduled to start OPPT on 03/08/18 @ 11:00. Please  arrive a few minutes early to complete paperwork   Contact information: [place] [address] [phone]            Signed: Erlene Senters 03/06/2018, 9:47 AM

## 2018-03-06 NOTE — Progress Notes (Signed)
Physical Therapy Treatment Patient Details Name: Mary Barnett MRN: [id number] DOB: [birth year] Today's Date: 03/06/2018    History of Present Illness Pt s/p R TKR and with hx of L TKR in 2016    PT Comments    Pt progressing well with mobility and eager for dc home.  Reviewed stairs and home therex program with written instruction provided.     Follow Up Recommendations  Follow surgeon's recommendation for DC plan and follow-up therapies     Equipment Recommendations  None recommended by PT    Recommendations for Other Services       Precautions / Restrictions Precautions Precautions: Fall;Knee Restrictions Weight Bearing Restrictions: No    Mobility  Bed Mobility Overal bed mobility: Needs Assistance Bed Mobility: Supine to Sit     Supine to sit: Supervision     General bed mobility comments: min cues for sequence but no physical assist  Transfers Overall transfer level: Needs assistance Equipment used: None;Rolling walker (2 wheeled) Transfers: Sit to/from Stand Sit to Stand: Supervision         General transfer comment: cues for LE management, use of UEs to self assist  Ambulation/Gait Ambulation/Gait assistance: Min guard;Supervision Gait Distance (Feet): 400 Feet Assistive device: 4-wheeled walker Gait Pattern/deviations: Step-to pattern;Step-through pattern;Decreased step length - right;Decreased step length - left;Shuffle     General Gait Details: cues for posture, position from RW and initial sequence   Stairs Stairs: Yes Stairs assistance: Min guard Stair Management: Step to pattern;Forwards;Two rails Number of Stairs: 5 General stair comments: cues for sequence   Wheelchair Mobility    Modified Rankin (Stroke Patients Only)       Balance Overall balance assessment: Mild deficits observed, not formally tested                                          Cognition Arousal/Alertness: Awake/alert Behavior During  Therapy: WFL for tasks assessed/performed Overall Cognitive Status: Within Functional Limits for tasks assessed                                        Exercises Total Joint Exercises Ankle Circles/Pumps: AROM;Both;15 reps;Supine Quad Sets: AROM;Both;10 reps;Supine Heel Slides: AAROM;Right;15 reps;Supine Straight Leg Raises: AROM;Right;Supine;20 reps Long Arc Quad: AROM;Right;10 reps;Seated Knee Flexion: AROM;AAROM;Right;15 reps;Seated Goniometric ROM: AAROM R knee -10 - 100    General Comments        Pertinent Vitals/Pain Pain Assessment: 0-10 Pain Score: 3  Pain Location: R knee Pain Descriptors / Indicators: Sore Pain Intervention(s): Limited activity within patient's tolerance;Monitored during session;Ice applied    Home Living                      Prior Function            PT Goals (current goals can now be found in the care plan section) Acute Rehab PT Goals Patient Stated Goal: Regain IND PT Goal Formulation: With patient Time For Goal Achievement: 03/11/18 Potential to Achieve Goals: Good Progress towards PT goals: Progressing toward goals    Frequency    7X/week      PT Plan Current plan remains appropriate    Co-evaluation              AM-PAC PT "6 Clicks" Daily Activity  Outcome Measure  Difficulty turning over in bed (including adjusting bedclothes, sheets and blankets)?: A Little Difficulty moving from lying on back to sitting on the side of the bed? : A Little Difficulty sitting down on and standing up from a chair with arms (e.g., wheelchair, bedside commode, etc,.)?: A Little Help needed moving to and from a bed to chair (including a wheelchair)?: A Little Help needed walking in hospital room?: A Little Help needed climbing 3-5 steps with a railing? : A Little 6 Click Score: 18    End of Session Equipment Utilized During Treatment: Gait belt Activity Tolerance: Patient tolerated treatment well Patient  left: with call bell/phone within reach;with family/visitor present;in chair Nurse Communication: Mobility status PT Visit Diagnosis: Difficulty in walking, not elsewhere classified (R26.2)     Time: 8412-8208 PT Time Calculation (min) (ACUTE ONLY): 33 min  Charges:  $Gait Training: 8-22 mins $Therapeutic Exercise: 8-22 mins                     [place] Pager [phone] Office [phone]    Sueko Dimichele 03/06/2018, 12:35 PM

## 2018-03-06 NOTE — Care Management Note (Signed)
Case Management Note  Patient Details  Name: Mary Barnett MRN: [id number] Date of Birth: 1950/06/09  Subjective/Objective:    S/p R TKR                Action/Plan: Spoke to pt and states she is scheduled for OPPT. She has RW and 3n1 bedside commode at home. Husband will be at home to assist with care.    Expected Discharge Date:  03/06/18               Expected Discharge Plan:  OP Rehab  In-House Referral:  NA  Discharge planning Services  CM Consult  Post Acute Care Choice:  NA Choice offered to:  NA  DME Arranged:  CPM DME Agency:  [employer]  HH Arranged:  NA HH Agency:  NA  Status of Service:  Completed, signed off  If discussed at [place] of Stay Meetings, dates discussed:    Additional Comments:  Erenest Rasher, RN 03/06/2018, 9:55 AM

## 2018-03-06 NOTE — Progress Notes (Signed)
Discharged from floor via w/c for transport home by car. Belongings & daughter with pt. No changes in assessment. Mary Barnett, [employer]

## 2018-03-06 NOTE — Progress Notes (Signed)
Subjective: 2 Days Post-Op Procedure(s) (LRB): TOTAL KNEE ARTHROPLASTY (Right) Patient reports pain as mild.  Taking by mouth and voiding okay.  Doing well with physical therapy.  Ready for discharge home.  Objective: Vital signs in last 24 hours: Temp:  [97.5 F (36.4 C)-98.5 F (36.9 C)] 98 F (36.7 C) (11/10 0529) Pulse Rate:  [93-109] 105 (11/10 0529) Resp:  [16-18] 16 (11/10 0529) BP: (97-130)/(57-77) 130/71 (11/10 0529) SpO2:  [97 %-100 %] 98 % (11/10 0529)  Intake/Output from previous day: 11/09 0701 - 11/10 0700 In: 754.9 [P.O.:480; I.V.:274.9] Out: 900 [Urine:900] Intake/Output this shift: No intake/output data recorded.  Recent Labs    03/05/18 0414 03/06/18 0410  HGB 11.1* 10.2*   Recent Labs    03/05/18 0414 03/06/18 0410  WBC 11.6* 13.4*  RBC 3.73* 3.46*  HCT 35.7* 33.0*  PLT 195 184   No results for input(s): NA, K, CL, CO2, BUN, CREATININE, GLUCOSE, CALCIUM in the last 72 hours. No results for input(s): LABPT, INR in the last 72 hours. Right knee exam: Neurovascular intact Sensation intact distally Intact pulses distally Dorsiflexion/Plantar flexion intact Incision: dressing C/D/I Compartment soft  Anticipated LOS equal to or greater than 2 midnights due to - Age 65 and older with one or more of the following:  - Obesity  - Expected need for hospital services (PT, OT, Nursing) required for safe  discharge  - Anticipated need for postoperative skilled nursing care or inpatient rehab  - Active co-morbidities: None OR   - Unanticipated findings during/Post Surgery: Slow post-op progression: GI, pain control, mobility  - Patient is a high risk of re-admission due to: None   Assessment/Plan: 2 Days Post-Op Procedure(s) (LRB): TOTAL KNEE ARTHROPLASTY (Right) Plan: Up with physical therapy. Weightbearing as tolerated on right lower extremity. Discharge home with home health  Follow-up with Dr. Berenice Primas in 2 weeks.    Erlene Senters 03/06/2018, 9:43 AM

## 2018-03-07 ENCOUNTER — Encounter (HOSPITAL_COMMUNITY): Payer: Self-pay | Admitting: Orthopedic Surgery

## 2018-03-08 DIAGNOSIS — Z96651 Presence of right artificial knee joint: Secondary | ICD-10-CM | POA: Diagnosis not present

## 2018-03-08 DIAGNOSIS — M25661 Stiffness of right knee, not elsewhere classified: Secondary | ICD-10-CM | POA: Diagnosis not present

## 2018-03-09 DIAGNOSIS — M25661 Stiffness of right knee, not elsewhere classified: Secondary | ICD-10-CM | POA: Diagnosis not present

## 2018-03-09 DIAGNOSIS — Z96651 Presence of right artificial knee joint: Secondary | ICD-10-CM | POA: Diagnosis not present

## 2018-03-11 DIAGNOSIS — Z96651 Presence of right artificial knee joint: Secondary | ICD-10-CM | POA: Diagnosis not present

## 2018-03-11 DIAGNOSIS — M25661 Stiffness of right knee, not elsewhere classified: Secondary | ICD-10-CM | POA: Diagnosis not present

## 2018-03-14 DIAGNOSIS — M25661 Stiffness of right knee, not elsewhere classified: Secondary | ICD-10-CM | POA: Diagnosis not present

## 2018-03-14 DIAGNOSIS — Z96651 Presence of right artificial knee joint: Secondary | ICD-10-CM | POA: Diagnosis not present

## 2018-03-15 DIAGNOSIS — M25661 Stiffness of right knee, not elsewhere classified: Secondary | ICD-10-CM | POA: Diagnosis not present

## 2018-03-15 DIAGNOSIS — Z96651 Presence of right artificial knee joint: Secondary | ICD-10-CM | POA: Diagnosis not present

## 2018-03-17 DIAGNOSIS — Z96651 Presence of right artificial knee joint: Secondary | ICD-10-CM | POA: Diagnosis not present

## 2018-03-17 DIAGNOSIS — M25661 Stiffness of right knee, not elsewhere classified: Secondary | ICD-10-CM | POA: Diagnosis not present

## 2018-03-21 DIAGNOSIS — Z96651 Presence of right artificial knee joint: Secondary | ICD-10-CM | POA: Diagnosis not present

## 2018-03-21 DIAGNOSIS — M25661 Stiffness of right knee, not elsewhere classified: Secondary | ICD-10-CM | POA: Diagnosis not present

## 2018-03-22 DIAGNOSIS — M25661 Stiffness of right knee, not elsewhere classified: Secondary | ICD-10-CM | POA: Diagnosis not present

## 2018-03-22 DIAGNOSIS — Z96651 Presence of right artificial knee joint: Secondary | ICD-10-CM | POA: Diagnosis not present

## 2018-03-25 DIAGNOSIS — M25661 Stiffness of right knee, not elsewhere classified: Secondary | ICD-10-CM | POA: Diagnosis not present

## 2018-03-25 DIAGNOSIS — Z96651 Presence of right artificial knee joint: Secondary | ICD-10-CM | POA: Diagnosis not present

## 2018-03-28 DIAGNOSIS — Z96651 Presence of right artificial knee joint: Secondary | ICD-10-CM | POA: Diagnosis not present

## 2018-03-28 DIAGNOSIS — M25661 Stiffness of right knee, not elsewhere classified: Secondary | ICD-10-CM | POA: Diagnosis not present

## 2018-03-29 DIAGNOSIS — Z0000 Encounter for general adult medical examination without abnormal findings: Secondary | ICD-10-CM | POA: Diagnosis not present

## 2018-03-29 DIAGNOSIS — K429 Umbilical hernia without obstruction or gangrene: Secondary | ICD-10-CM | POA: Diagnosis not present

## 2018-03-29 DIAGNOSIS — M1711 Unilateral primary osteoarthritis, right knee: Secondary | ICD-10-CM | POA: Diagnosis not present

## 2018-03-29 DIAGNOSIS — Z96651 Presence of right artificial knee joint: Secondary | ICD-10-CM | POA: Diagnosis not present

## 2018-03-29 DIAGNOSIS — Z01419 Encounter for gynecological examination (general) (routine) without abnormal findings: Secondary | ICD-10-CM | POA: Diagnosis not present

## 2018-03-29 DIAGNOSIS — M25661 Stiffness of right knee, not elsewhere classified: Secondary | ICD-10-CM | POA: Diagnosis not present

## 2018-03-29 DIAGNOSIS — K439 Ventral hernia without obstruction or gangrene: Secondary | ICD-10-CM | POA: Diagnosis not present

## 2018-03-31 ENCOUNTER — Other Ambulatory Visit: Payer: Self-pay | Admitting: Family Medicine

## 2018-03-31 DIAGNOSIS — Z1231 Encounter for screening mammogram for malignant neoplasm of breast: Secondary | ICD-10-CM

## 2018-04-01 DIAGNOSIS — M25661 Stiffness of right knee, not elsewhere classified: Secondary | ICD-10-CM | POA: Diagnosis not present

## 2018-04-01 DIAGNOSIS — Z96651 Presence of right artificial knee joint: Secondary | ICD-10-CM | POA: Diagnosis not present

## 2018-04-04 DIAGNOSIS — Z96651 Presence of right artificial knee joint: Secondary | ICD-10-CM | POA: Diagnosis not present

## 2018-04-04 DIAGNOSIS — M25661 Stiffness of right knee, not elsewhere classified: Secondary | ICD-10-CM | POA: Diagnosis not present

## 2018-04-08 DIAGNOSIS — Z96651 Presence of right artificial knee joint: Secondary | ICD-10-CM | POA: Diagnosis not present

## 2018-04-08 DIAGNOSIS — M25661 Stiffness of right knee, not elsewhere classified: Secondary | ICD-10-CM | POA: Diagnosis not present

## 2018-04-11 DIAGNOSIS — M25661 Stiffness of right knee, not elsewhere classified: Secondary | ICD-10-CM | POA: Diagnosis not present

## 2018-04-11 DIAGNOSIS — Z96651 Presence of right artificial knee joint: Secondary | ICD-10-CM | POA: Diagnosis not present

## 2018-04-13 DIAGNOSIS — Z96651 Presence of right artificial knee joint: Secondary | ICD-10-CM | POA: Diagnosis not present

## 2018-04-13 DIAGNOSIS — M25661 Stiffness of right knee, not elsewhere classified: Secondary | ICD-10-CM | POA: Diagnosis not present

## 2018-04-18 DIAGNOSIS — Z96651 Presence of right artificial knee joint: Secondary | ICD-10-CM | POA: Diagnosis not present

## 2018-04-18 DIAGNOSIS — M25661 Stiffness of right knee, not elsewhere classified: Secondary | ICD-10-CM | POA: Diagnosis not present

## 2018-04-21 DIAGNOSIS — M25661 Stiffness of right knee, not elsewhere classified: Secondary | ICD-10-CM | POA: Diagnosis not present

## 2018-04-21 DIAGNOSIS — Z96651 Presence of right artificial knee joint: Secondary | ICD-10-CM | POA: Diagnosis not present

## 2018-05-11 ENCOUNTER — Ambulatory Visit
Admission: RE | Admit: 2018-05-11 | Discharge: 2018-05-11 | Disposition: A | Discharge status: Home / Self Care | Payer: Medicare Other | Source: Ambulatory Visit | Attending: Family Medicine | Admitting: Family Medicine

## 2018-05-11 DIAGNOSIS — Z1231 Encounter for screening mammogram for malignant neoplasm of breast: Secondary | ICD-10-CM | POA: Diagnosis not present

## 2018-05-12 DIAGNOSIS — M1711 Unilateral primary osteoarthritis, right knee: Secondary | ICD-10-CM | POA: Diagnosis not present

## 2018-06-10 DIAGNOSIS — N200 Calculus of kidney: Secondary | ICD-10-CM | POA: Diagnosis not present

## 2018-06-22 DIAGNOSIS — N200 Calculus of kidney: Secondary | ICD-10-CM | POA: Diagnosis not present

## 2018-06-22 DIAGNOSIS — R3121 Asymptomatic microscopic hematuria: Secondary | ICD-10-CM | POA: Diagnosis not present

## 2018-07-18 ENCOUNTER — Other Ambulatory Visit: Payer: Self-pay | Admitting: Urology

## 2018-07-18 DIAGNOSIS — R3121 Asymptomatic microscopic hematuria: Secondary | ICD-10-CM | POA: Diagnosis not present

## 2018-07-18 DIAGNOSIS — N201 Calculus of ureter: Secondary | ICD-10-CM | POA: Diagnosis not present

## 2018-07-21 ENCOUNTER — Ambulatory Visit (HOSPITAL_COMMUNITY): Payer: Medicare Other

## 2018-07-21 ENCOUNTER — Ambulatory Visit (HOSPITAL_COMMUNITY)
Admission: RE | Admit: 2018-07-21 | Discharge: 2018-07-21 | Disposition: A | Discharge status: Home / Self Care | Payer: Medicare Other | Attending: Urology | Admitting: Urology

## 2018-07-21 ENCOUNTER — Encounter (HOSPITAL_COMMUNITY): Payer: Self-pay | Admitting: General Practice

## 2018-07-21 ENCOUNTER — Encounter (HOSPITAL_COMMUNITY)
Admission: RE | Disposition: A | Discharge status: Home / Self Care | Payer: Self-pay | Source: Home / Self Care | Attending: Urology

## 2018-07-21 DIAGNOSIS — Z87442 Personal history of urinary calculi: Secondary | ICD-10-CM | POA: Insufficient documentation

## 2018-07-21 DIAGNOSIS — Z79899 Other long term (current) drug therapy: Secondary | ICD-10-CM | POA: Insufficient documentation

## 2018-07-21 DIAGNOSIS — N201 Calculus of ureter: Secondary | ICD-10-CM | POA: Insufficient documentation

## 2018-07-21 DIAGNOSIS — I10 Essential (primary) hypertension: Secondary | ICD-10-CM | POA: Insufficient documentation

## 2018-07-21 DIAGNOSIS — N200 Calculus of kidney: Secondary | ICD-10-CM

## 2018-07-21 DIAGNOSIS — E7800 Pure hypercholesterolemia, unspecified: Secondary | ICD-10-CM | POA: Insufficient documentation

## 2018-07-21 DIAGNOSIS — Z888 Allergy status to other drugs, medicaments and biological substances status: Secondary | ICD-10-CM | POA: Insufficient documentation

## 2018-07-21 HISTORY — PX: EXTRACORPOREAL SHOCK WAVE LITHOTRIPSY: SHX1557

## 2018-07-21 SURGERY — LITHOTRIPSY, ESWL
Anesthesia: LOCAL | Laterality: Left

## 2018-07-21 MED ORDER — OXYCODONE-ACETAMINOPHEN 5-325 MG PO TABS: 1.0000 | ORAL_TABLET | Freq: Four times a day (QID) | ORAL | 0 refills | Status: DC | PRN

## 2018-07-21 MED ORDER — TAMSULOSIN HCL 0.4 MG PO CAPS: 0.4000 mg | ORAL_CAPSULE | Freq: Every day | ORAL | 0 refills | Status: DC

## 2018-07-21 MED ORDER — CIPROFLOXACIN HCL 500 MG PO TABS
500.0000 mg | ORAL_TABLET | ORAL | Status: AC
  Administered 2018-07-21: 500 mg via ORAL
  Filled 2018-07-21: qty 1

## 2018-07-21 MED ORDER — DIPHENHYDRAMINE HCL 25 MG PO CAPS
25.0000 mg | ORAL_CAPSULE | ORAL | Status: AC
  Administered 2018-07-21: 25 mg via ORAL
  Filled 2018-07-21: qty 1

## 2018-07-21 MED ORDER — DIAZEPAM 5 MG PO TABS
10.0000 mg | ORAL_TABLET | ORAL | Status: AC
  Administered 2018-07-21: 10 mg via ORAL
  Filled 2018-07-21: qty 2

## 2018-07-21 MED ORDER — SODIUM CHLORIDE 0.9 % IV SOLN
INTRAVENOUS | Status: DC
  Administered 2018-07-21: 07:00:00 via INTRAVENOUS

## 2018-07-21 NOTE — Op Note (Signed)
[facility] Operative Note  Treating Physician: Ellison Hughs, MD  Pre-op diagnosis: 5 mm left UPJ stone  Post-op diagnosis: Same   Procedure: LEFT ESWL  See Aris Everts OP note scanned into chart. Also because of the size, density, location and other factors that cannot be anticipated I feel this will likely be a staged procedure. This fact supersedes any indication in the scanned [place] stone operative note to the contrary

## 2018-07-21 NOTE — Discharge Instructions (Signed)
Dietary Guidelines to Help Prevent Kidney Stones Kidney stones are deposits of minerals and salts that form inside your kidneys. Your risk of developing kidney stones may be greater depending on your diet, your lifestyle, the medicines you take, and whether you have certain medical conditions. Most people can reduce their chances of developing kidney stones by following the instructions below. Depending on your overall health and the type of kidney stones you tend to develop, your dietitian may give you more specific instructions. What are tips for following this plan? Reading food labels  Choose foods with "no salt added" or "low-salt" labels. Limit your sodium intake to less than 1500 mg per day.  Choose foods with calcium for each meal and snack. Try to eat about 300 mg of calcium at each meal. Foods that contain 200-500 mg of calcium per serving include: ? 8 oz (237 ml) of milk, fortified nondairy milk, and fortified fruit juice. ? 8 oz (237 ml) of kefir, yogurt, and soy yogurt. ? 4 oz (118 ml) of tofu. ? 1 oz of cheese. ? 1 cup (300 g) of dried figs. ? 1 cup (91 g) of cooked broccoli. ? 1-3 oz can of sardines or mackerel.  Most people need 1000 to 1500 mg of calcium each day. Talk to your dietitian about how much calcium is recommended for you. Shopping  Buy plenty of fresh fruits and vegetables. Most people do not need to avoid fruits and vegetables, even if they contain nutrients that may contribute to kidney stones.  When shopping for convenience foods, choose: ? Whole pieces of fruit. ? Premade salads with dressing on the side. ? Low-fat fruit and yogurt smoothies.  Avoid buying frozen meals or prepared deli foods.  Look for foods with live cultures, such as yogurt and kefir. Cooking  Do not add salt to food when cooking. Place a salt shaker on the table and allow each person to add his or her own salt to taste.  Use vegetable protein, such as beans, textured vegetable  protein (TVP), or tofu instead of meat in pasta, casseroles, and soups. Meal planning   Eat less salt, if told by your dietitian. To do this: ? Avoid eating processed or premade food. ? Avoid eating fast food.  Eat less animal protein, including cheese, meat, poultry, or fish, if told by your dietitian. To do this: ? Limit the number of times you have meat, poultry, fish, or cheese each week. Eat a diet free of meat at least 2 days a week. ? Eat only one serving each day of meat, poultry, fish, or seafood. ? When you prepare animal protein, cut pieces into small portion sizes. For most meat and fish, one serving is about the size of one deck of cards.  Eat at least 5 servings of fresh fruits and vegetables each day. To do this: ? Keep fruits and vegetables on hand for snacks. ? Eat 1 piece of fruit or a handful of berries with breakfast. ? Have a salad and fruit at lunch. ? Have two kinds of vegetables at dinner.  Limit foods that are high in a substance called oxalate. These include: ? Spinach. ? Rhubarb. ? Beets. ? Potato chips and french fries. ? Nuts.  If you regularly take a diuretic medicine, make sure to eat at least 1-2 fruits or vegetables high in potassium each day. These include: ? Avocado. ? Banana. ? Orange, prune, carrot, or tomato juice. ? Baked potato. ? Cabbage. ? Beans and split   split peas. General instructions   Drink enough fluid to keep your urine clear or pale yellow. This is the most important thing you can do.  Talk to your health care provider and dietitian about taking daily supplements. Depending on your health and the cause of your kidney stones, you may be advised: ? Not to take supplements with vitamin C. ? To take a calcium supplement. ? To take a daily probiotic supplement. ? To take other supplements such as magnesium, fish oil, or vitamin B6.  Take all medicines and supplements as told by your health care provider.  Limit alcohol intake to no  more than 1 drink a day for nonpregnant women and 2 drinks a day for men. One drink equals 12 oz of beer, 5 oz of wine, or 1 oz of hard liquor.  Lose weight if told by your health care provider. Work with your dietitian to find strategies and an eating plan that works best for you. What foods are not recommended? Limit your intake of the following foods, or as told by your dietitian. Talk to your dietitian about specific foods you should avoid based on the type of kidney stones and your overall health. Grains Breads. Bagels. Rolls. Baked goods. Salted crackers. Cereal. Pasta. Vegetables Spinach. Rhubarb. Beets. Canned vegetables. Angie Fava. Olives. Meats and other protein foods Nuts. Nut butters. Large portions of meat, poultry, or fish. Salted or cured meats. Deli meats. Hot dogs. Sausages. Dairy Cheese. Beverages Regular soft drinks. Regular vegetable juice. Seasonings and other foods Seasoning blends with salt. Salad dressings. Canned soups. Soy sauce. Ketchup. Barbecue sauce. Canned pasta sauce. Casseroles. Pizza. Lasagna. Frozen meals. Potato chips. [place] fries. Summary  You can reduce your risk of kidney stones by making changes to your diet.  The most important thing you can do is drink enough fluid. You should drink enough fluid to keep your urine clear or pale yellow.  Ask your health care provider or dietitian how much protein from animal sources you should eat each day, and also how much salt and calcium you should have each day. This information is not intended to replace advice given to you by your health care provider. Make sure you discuss any questions you have with your health care provider. Document Released: 08/08/2010 Document Revised: 03/24/2016 Document Reviewed: 03/24/2016 Elsevier Interactive Patient Education  2019 [employer]. Dietary Guidelines to Help Prevent Kidney Stones Kidney stones are deposits of minerals and salts that form inside your kidneys. Your  risk of developing kidney stones may be greater depending on your diet, your lifestyle, the medicines you take, and whether you have certain medical conditions. Most people can reduce their chances of developing kidney stones by following the instructions below. Depending on your overall health and the type of kidney stones you tend to develop, your dietitian may give you more specific instructions. What are tips for following this plan? Reading food labels  Choose foods with "no salt added" or "low-salt" labels. Limit your sodium intake to less than 1500 mg per day.  Choose foods with calcium for each meal and snack. Try to eat about 300 mg of calcium at each meal. Foods that contain 200-500 mg of calcium per serving include: ? 8 oz (237 ml) of milk, fortified nondairy milk, and fortified fruit juice. ? 8 oz (237 ml) of kefir, yogurt, and soy yogurt. ? 4 oz (118 ml) of tofu. ? 1 oz of cheese. ? 1 cup (300 g) of dried figs. ? 1 cup (91  g) of cooked broccoli. ? 1-3 oz can of sardines or mackerel.  Most people need 1000 to 1500 mg of calcium each day. Talk to your dietitian about how much calcium is recommended for you. Shopping  Buy plenty of fresh fruits and vegetables. Most people do not need to avoid fruits and vegetables, even if they contain nutrients that may contribute to kidney stones.  When shopping for convenience foods, choose: ? Whole pieces of fruit. ? Premade salads with dressing on the side. ? Low-fat fruit and yogurt smoothies.  Avoid buying frozen meals or prepared deli foods.  Look for foods with live cultures, such as yogurt and kefir. Cooking  Do not add salt to food when cooking. Place a salt shaker on the table and allow each person to add his or her own salt to taste.  Use vegetable protein, such as beans, textured vegetable protein (TVP), or tofu instead of meat in pasta, casseroles, and soups. Meal planning   Eat less salt, if told by your dietitian. To do  this: ? Avoid eating processed or premade food. ? Avoid eating fast food.  Eat less animal protein, including cheese, meat, poultry, or fish, if told by your dietitian. To do this: ? Limit the number of times you have meat, poultry, fish, or cheese each week. Eat a diet free of meat at least 2 days a week. ? Eat only one serving each day of meat, poultry, fish, or seafood. ? When you prepare animal protein, cut pieces into small portion sizes. For most meat and fish, one serving is about the size of one deck of cards.  Eat at least 5 servings of fresh fruits and vegetables each day. To do this: ? Keep fruits and vegetables on hand for snacks. ? Eat 1 piece of fruit or a handful of berries with breakfast. ? Have a salad and fruit at lunch. ? Have two kinds of vegetables at dinner.  Limit foods that are high in a substance called oxalate. These include: ? Spinach. ? Rhubarb. ? Beets. ? Potato chips and french fries. ? Nuts.  If you regularly take a diuretic medicine, make sure to eat at least 1-2 fruits or vegetables high in potassium each day. These include: ? Avocado. ? Banana. ? Orange, prune, carrot, or tomato juice. ? Baked potato. ? Cabbage. ? Beans and split peas. General instructions   Drink enough fluid to keep your urine clear or pale yellow. This is the most important thing you can do.  Talk to your health care provider and dietitian about taking daily supplements. Depending on your health and the cause of your kidney stones, you may be advised: ? Not to take supplements with vitamin C. ? To take a calcium supplement. ? To take a daily probiotic supplement. ? To take other supplements such as magnesium, fish oil, or vitamin B6.  Take all medicines and supplements as told by your health care provider.  Limit alcohol intake to no more than 1 drink a day for nonpregnant women and 2 drinks a day for men. One drink equals 12 oz of beer, 5 oz of wine, or 1 oz of hard  liquor.  Lose weight if told by your health care provider. Work with your dietitian to find strategies and an eating plan that works best for you. What foods are not recommended? Limit your intake of the following foods, or as told by your dietitian. Talk to your dietitian about specific foods you should avoid based  on the type of kidney stones and your overall health. Grains Breads. Bagels. Rolls. Baked goods. Salted crackers. Cereal. Pasta. Vegetables Spinach. Rhubarb. Beets. Canned vegetables. Angie Fava. Olives. Meats and other protein foods Nuts. Nut butters. Large portions of meat, poultry, or fish. Salted or cured meats. Deli meats. Hot dogs. Sausages. Dairy Cheese. Beverages Regular soft drinks. Regular vegetable juice. Seasonings and other foods Seasoning blends with salt. Salad dressings. Canned soups. Soy sauce. Ketchup. Barbecue sauce. Canned pasta sauce. Casseroles. Pizza. Lasagna. Frozen meals. Potato chips. [place] fries. Summary  You can reduce your risk of kidney stones by making changes to your diet.  The most important thing you can do is drink enough fluid. You should drink enough fluid to keep your urine clear or pale yellow.  Ask your health care provider or dietitian how much protein from animal sources you should eat each day, and also how much salt and calcium you should have each day. This information is not intended to replace advice given to you by your health care provider. Make sure you discuss any questions you have with your health care provider. Document Released: 08/08/2010 Document Revised: 03/24/2016 Document Reviewed: 03/24/2016 Elsevier Interactive Patient Education  [employer].

## 2018-07-21 NOTE — H&P (Signed)
Pre-op H&P  CC: I have kidney stones.  HPI: Mary Barnett is a 68 year-old female established patient who is here for renal calculi.  The problem is on the right side. She first stated noticing pain on 06/08/2018.   06/10/18: Patient with prior history of renal stones, with most recent event requiring ESWL in 08/2016. She presents today with complaints of suprapubic pressure. She states that she had severe episode of suprapubic pressure associated with nausea about 2 days ago. Since then the pain has subsided, but she continue to have intermittent discomfort. She denies unilateral flank pain. No exacerbation of voiding symptoms, gross hematuria, dysuria, fevers, or chills. She denies interval episodes of nausea or vomiting.   July 08, 2018: Patient with above noted history. At prior OV, there were no definitive stones noted on KUB and there was no overt hydronephrosis. Today, she returns for follow up. She has been relatively asymptomatic since prior OV, but has had some intermittent episodes of discomfort in her right flank. She denies exacerbation of voiding symptoms, gross hematuria, fevers, chills, nausea, or vomiting. She has not seen a stone pass.   07/18/18: Mary Barnett returns in f/u for a left UPJ stone. She had some hematuria last Weds but has had no pain. She has had no voiding complaints, nausea or fever. A KUB today shows no change in the 3 mm left proximal stone. There is a stable RUP stone as well.     ALLERGIES: Cortisone - Other Reaction, facial flushing    MEDICATIONS: Calcium Citrate- Vitamin D 315 mg-250 unit tablet Oral  Diclofenac Sodium 75 mg tablet, delayed release Oral  Losartan Potassium 100 mg tablet Oral  Simvastatin 20 mg tablet Oral     GU PSH: ESWL - 12/10/2016, 2016 Hysterectomy Unilat SO - 2016    NON-GU PSH: Back Surgery (Unspecified) Colonoscopy - 2000 Knee Arthroscopy/surgery Repair/graft Femur Head/neck    GU PMH: Renal calculus - 07-08-18, She has passed her  treated stone and has stable renal stones. her residual pain is probably musculoskeletal and I recommended she see a chiropractor. , - 12/21/2016, Right nephrolithiasis, - 2017 Microscopic hematuria - 06/10/2018 Flank Pain (Acute), Left, Secondary to left UPJ calculus. - 12/03/2016 Gross hematuria (Acute), Secondary to left UPJ calculus. - 12/03/2016, Gross hematuria, - 2017 Ureteral calculus - 2017 Dorsalgia, Unspec, Back pain - 2017 Urinary Tract Inf, Unspec site, Suspected urinary tract infection - 2017 Abdominal Pain Unspec, Right flank pain - 2016 History of urolithiasis, History of renal calculi - 2016    NON-GU PMH: Nausea, Nausea - 2017 Encounter for general adult medical examination without abnormal findings, Encounter for preventive health examination - 2016 Personal history of other diseases of the circulatory system, History of hypertension - 2016 Personal history of other diseases of the musculoskeletal system and connective tissue, History of arthritis - 2016 Personal history of other endocrine, nutritional and metabolic disease, History of hypercholesterolemia - 2016    FAMILY HISTORY: 1 Daughter - Runs in Family Hematuria - Runs In Family Kidney Stones - Runs In Family Lung Cancer - Runs In Family   SOCIAL HISTORY: Marital Status: Married Preferred Language: English; Ethnicity: Not Hispanic Or Latino; Race: White Current Smoking Status: Patient has never smoked.   Tobacco Use Assessment Completed: Used Tobacco in last 30 days? Does not use smokeless tobacco. Does not drink anymore.  Does not use drugs. Drinks 1 caffeinated drink per day. Has had a blood transfusion.    REVIEW OF SYSTEMS:    GU Review Female:  Patient denies frequent urination, hard to postpone urination, burning /pain with urination, get up at night to urinate, leakage of urine, stream starts and stops, trouble starting your stream, have to strain to urinate, and being pregnant.  Gastrointestinal  (Upper):   Patient denies nausea, vomiting, and indigestion/ heartburn.  Gastrointestinal (Lower):   Patient denies diarrhea and constipation.  Constitutional:   Patient denies fever, night sweats, weight loss, and fatigue.  Skin:   Patient denies skin rash/ lesion and itching.  Eyes:   Patient denies blurred vision and double vision.  Ears/ Nose/ Throat:   Patient denies sore throat and sinus problems.  Hematologic/Lymphatic:   Patient denies easy bruising and swollen glands.  Cardiovascular:   Patient denies leg swelling and chest pains.  Respiratory:   Patient denies cough and shortness of breath.  Endocrine:   Patient denies excessive thirst.  Musculoskeletal:   Patient denies back pain and joint pain.  Neurological:   Patient denies headaches and dizziness.  Psychologic:   Patient denies depression and anxiety.   Notes: hematuria last week "rusty color"    VITAL SIGNS:      07/18/2018 08:20 AM  BP 168/79 mmHg  Heart Rate 98 /min  Temperature 98.4 F / 36.8 C   MULTI-SYSTEM PHYSICAL EXAMINATION:    Constitutional: Well-nourished. No physical deformities. Normally developed. Good grooming.  Respiratory: No labored breathing, no use of accessory muscles.      PAST DATA REVIEWED:  Source Of History:  Patient  Urine Test Review:   Urinalysis  X-Ray Review: KUB: Reviewed Films. Discussed With Patient.     PROCEDURES:         KUB - [id number]  A single view of the abdomen is obtained.      Patient confirmed No Neulasta OnPro Device.           Urinalysis w/Scope Dipstick Dipstick Cont'd Micro  Color: Yellow Bilirubin: Neg mg/dL WBC/hpf: 0 - 5/hpf  Appearance: Clear Ketones: Neg mg/dL RBC/hpf: 20 - 40/hpf  Specific Gravity: 1.025 Blood: 3+ ery/uL Bacteria: Rare (0-9/hpf)  pH: 6.0 Protein: Neg mg/dL Cystals: NS (Not Seen)  Glucose: Neg mg/dL Urobilinogen: 0.2 mg/dL Casts: NS (Not Seen)    Nitrites: Neg Trichomonas: Not Present    Leukocyte Esterase: 1+ leu/uL Mucous: Present       Epithelial Cells: NS (Not Seen)      Yeast: NS (Not Seen)      Sperm: Not Present    ASSESSMENT:      ICD-10 Details  1 GU:   Ureteral calculus - N20.1 Her stone hasn't moved. I discussed the options of continued MET, URS and ESWL and will get her set up for ESWL. I reviewed the risks of ESWL including bleeding, infection, injury to the kidney or adjacent structures, failure to fragment the stone, need for ancillary procedures, thrombotic events, cardiac arrhythmias and sedation complications.   2   Microscopic hematuria - R31.21    PLAN:            Medications New Meds: Tamsulosin Hcl 0.4 mg capsule 1 capsule PO Daily   #30  1 Refill(s)            Schedule Return Visit/Planned Activity: Next Available Appointment - Schedule Surgery             Note: ESWL

## 2018-07-22 ENCOUNTER — Encounter (HOSPITAL_COMMUNITY): Payer: Self-pay | Admitting: Urology

## 2018-08-04 DIAGNOSIS — M1711 Unilateral primary osteoarthritis, right knee: Secondary | ICD-10-CM | POA: Diagnosis not present

## 2019-01-30 DIAGNOSIS — Z23 Encounter for immunization: Secondary | ICD-10-CM | POA: Diagnosis not present

## 2019-03-01 DIAGNOSIS — M940 Chondrocostal junction syndrome [Tietze]: Secondary | ICD-10-CM | POA: Diagnosis not present

## 2019-03-01 DIAGNOSIS — R0789 Other chest pain: Secondary | ICD-10-CM | POA: Diagnosis not present

## 2019-03-01 DIAGNOSIS — E785 Hyperlipidemia, unspecified: Secondary | ICD-10-CM | POA: Diagnosis not present

## 2019-03-01 DIAGNOSIS — I10 Essential (primary) hypertension: Secondary | ICD-10-CM | POA: Diagnosis not present

## 2019-03-07 DIAGNOSIS — L578 Other skin changes due to chronic exposure to nonionizing radiation: Secondary | ICD-10-CM | POA: Diagnosis not present

## 2019-03-07 DIAGNOSIS — L570 Actinic keratosis: Secondary | ICD-10-CM | POA: Diagnosis not present

## 2019-05-16 ENCOUNTER — Other Ambulatory Visit: Payer: Self-pay | Admitting: Family Medicine

## 2019-05-16 DIAGNOSIS — Z1231 Encounter for screening mammogram for malignant neoplasm of breast: Secondary | ICD-10-CM

## 2019-05-24 DIAGNOSIS — L821 Other seborrheic keratosis: Secondary | ICD-10-CM | POA: Diagnosis not present

## 2019-05-24 DIAGNOSIS — L570 Actinic keratosis: Secondary | ICD-10-CM | POA: Diagnosis not present

## 2019-05-24 DIAGNOSIS — L819 Disorder of pigmentation, unspecified: Secondary | ICD-10-CM | POA: Diagnosis not present

## 2019-05-25 DIAGNOSIS — H6692 Otitis media, unspecified, left ear: Secondary | ICD-10-CM | POA: Diagnosis not present

## 2019-05-25 DIAGNOSIS — H6092 Unspecified otitis externa, left ear: Secondary | ICD-10-CM | POA: Diagnosis not present

## 2019-05-25 DIAGNOSIS — H6122 Impacted cerumen, left ear: Secondary | ICD-10-CM | POA: Diagnosis not present

## 2019-05-25 DIAGNOSIS — Z6829 Body mass index (BMI) 29.0-29.9, adult: Secondary | ICD-10-CM | POA: Diagnosis not present

## 2019-05-25 DIAGNOSIS — E663 Overweight: Secondary | ICD-10-CM | POA: Diagnosis not present

## 2019-06-09 DIAGNOSIS — Z23 Encounter for immunization: Secondary | ICD-10-CM | POA: Diagnosis not present

## 2019-06-15 DIAGNOSIS — E559 Vitamin D deficiency, unspecified: Secondary | ICD-10-CM | POA: Diagnosis not present

## 2019-06-15 DIAGNOSIS — Z0000 Encounter for general adult medical examination without abnormal findings: Secondary | ICD-10-CM | POA: Diagnosis not present

## 2019-06-15 DIAGNOSIS — E8881 Metabolic syndrome: Secondary | ICD-10-CM | POA: Diagnosis not present

## 2019-06-15 DIAGNOSIS — I10 Essential (primary) hypertension: Secondary | ICD-10-CM | POA: Diagnosis not present

## 2019-06-15 DIAGNOSIS — M1990 Unspecified osteoarthritis, unspecified site: Secondary | ICD-10-CM | POA: Diagnosis not present

## 2019-06-15 DIAGNOSIS — E785 Hyperlipidemia, unspecified: Secondary | ICD-10-CM | POA: Diagnosis not present

## 2019-06-15 DIAGNOSIS — Z79899 Other long term (current) drug therapy: Secondary | ICD-10-CM | POA: Diagnosis not present

## 2019-06-20 DIAGNOSIS — H40023 Open angle with borderline findings, high risk, bilateral: Secondary | ICD-10-CM | POA: Diagnosis not present

## 2019-06-20 DIAGNOSIS — H5203 Hypermetropia, bilateral: Secondary | ICD-10-CM | POA: Diagnosis not present

## 2019-06-20 DIAGNOSIS — H2513 Age-related nuclear cataract, bilateral: Secondary | ICD-10-CM | POA: Diagnosis not present

## 2019-06-21 ENCOUNTER — Other Ambulatory Visit: Payer: Self-pay

## 2019-06-21 ENCOUNTER — Ambulatory Visit
Admission: RE | Admit: 2019-06-21 | Discharge: 2019-06-21 | Disposition: A | Discharge status: Home / Self Care | Payer: Medicare Other | Source: Ambulatory Visit | Attending: Family Medicine | Admitting: Family Medicine

## 2019-06-21 DIAGNOSIS — Z1231 Encounter for screening mammogram for malignant neoplasm of breast: Secondary | ICD-10-CM

## 2019-06-22 DIAGNOSIS — K760 Fatty (change of) liver, not elsewhere classified: Secondary | ICD-10-CM | POA: Diagnosis not present

## 2019-06-22 DIAGNOSIS — R1084 Generalized abdominal pain: Secondary | ICD-10-CM | POA: Diagnosis not present

## 2019-06-22 DIAGNOSIS — K824 Cholesterolosis of gallbladder: Secondary | ICD-10-CM | POA: Diagnosis not present

## 2019-06-28 DIAGNOSIS — N202 Calculus of kidney with calculus of ureter: Secondary | ICD-10-CM | POA: Diagnosis not present

## 2019-07-07 DIAGNOSIS — Z23 Encounter for immunization: Secondary | ICD-10-CM | POA: Diagnosis not present

## 2019-07-24 DIAGNOSIS — N202 Calculus of kidney with calculus of ureter: Secondary | ICD-10-CM | POA: Diagnosis not present

## 2019-08-18 DIAGNOSIS — N201 Calculus of ureter: Secondary | ICD-10-CM | POA: Diagnosis not present

## 2019-09-04 DIAGNOSIS — L819 Disorder of pigmentation, unspecified: Secondary | ICD-10-CM | POA: Diagnosis not present

## 2019-09-04 DIAGNOSIS — D229 Melanocytic nevi, unspecified: Secondary | ICD-10-CM | POA: Diagnosis not present

## 2019-09-04 DIAGNOSIS — L814 Other melanin hyperpigmentation: Secondary | ICD-10-CM | POA: Diagnosis not present

## 2019-09-04 DIAGNOSIS — D1801 Hemangioma of skin and subcutaneous tissue: Secondary | ICD-10-CM | POA: Diagnosis not present

## 2019-09-04 DIAGNOSIS — I8393 Asymptomatic varicose veins of bilateral lower extremities: Secondary | ICD-10-CM | POA: Diagnosis not present

## 2019-09-04 DIAGNOSIS — L821 Other seborrheic keratosis: Secondary | ICD-10-CM | POA: Diagnosis not present

## 2019-09-18 DIAGNOSIS — R310 Gross hematuria: Secondary | ICD-10-CM | POA: Diagnosis not present

## 2019-09-18 DIAGNOSIS — N200 Calculus of kidney: Secondary | ICD-10-CM | POA: Diagnosis not present

## 2019-09-18 DIAGNOSIS — N202 Calculus of kidney with calculus of ureter: Secondary | ICD-10-CM | POA: Diagnosis not present

## 2019-11-24 DIAGNOSIS — N201 Calculus of ureter: Secondary | ICD-10-CM | POA: Diagnosis not present

## 2019-11-24 DIAGNOSIS — R310 Gross hematuria: Secondary | ICD-10-CM | POA: Diagnosis not present

## 2019-11-30 ENCOUNTER — Other Ambulatory Visit: Payer: Self-pay | Admitting: Urology

## 2019-12-04 ENCOUNTER — Other Ambulatory Visit: Payer: Self-pay | Admitting: Urology

## 2019-12-11 ENCOUNTER — Other Ambulatory Visit: Payer: Self-pay | Admitting: Urology

## 2020-01-02 DIAGNOSIS — M791 Myalgia, unspecified site: Secondary | ICD-10-CM | POA: Diagnosis not present

## 2020-01-02 DIAGNOSIS — I10 Essential (primary) hypertension: Secondary | ICD-10-CM | POA: Diagnosis not present

## 2020-01-02 DIAGNOSIS — E785 Hyperlipidemia, unspecified: Secondary | ICD-10-CM | POA: Diagnosis not present

## 2020-01-02 DIAGNOSIS — Z79899 Other long term (current) drug therapy: Secondary | ICD-10-CM | POA: Diagnosis not present

## 2020-01-02 DIAGNOSIS — T466X5A Adverse effect of antihyperlipidemic and antiarteriosclerotic drugs, initial encounter: Secondary | ICD-10-CM | POA: Diagnosis not present

## 2020-01-11 DIAGNOSIS — N200 Calculus of kidney: Secondary | ICD-10-CM | POA: Diagnosis not present

## 2020-01-15 ENCOUNTER — Other Ambulatory Visit (HOSPITAL_COMMUNITY)
Admission: RE | Admit: 2020-01-15 | Discharge: 2020-01-15 | Disposition: A | Discharge status: Home / Self Care | Payer: Medicare Other | Source: Ambulatory Visit | Attending: Urology | Admitting: Urology

## 2020-01-15 DIAGNOSIS — Z20822 Contact with and (suspected) exposure to covid-19: Secondary | ICD-10-CM | POA: Diagnosis not present

## 2020-01-15 DIAGNOSIS — Z01812 Encounter for preprocedural laboratory examination: Secondary | ICD-10-CM | POA: Diagnosis not present

## 2020-01-15 LAB — SARS CORONAVIRUS 2 (TAT 6-24 HRS): SARS Coronavirus 2: NEGATIVE

## 2020-01-16 NOTE — Progress Notes (Signed)
talked with patient. Instructions given Clear liquids until 10 AM arrival time 1200. Driver secured

## 2020-01-17 NOTE — H&P (Signed)
I have kidney stones.  HPI: Mary Barnett is a 69 year-old female established patient who is here for renal calculi.  11/24/19: Patient with below noted hx. She returns today for follow up. Urine culture at prior OV was noted to be positive for Enterococcus and she was treated accordingly. She underwent CT imaging, which revealed bilateral non obstructive calculi. She denies interval passage of stone material. She noted an episode of gross hematuria about two weeks ago and then had another episode yesterday. She denies any current flank or abdominal pain. No complaints of dysuria or exacerbation of voiding symptoms. She denies fever, chills, nausea, or vomiting.   09/18/19: Patient with below noted history. She presents today with complaints of gross hematuria. She states that this initially began about 1 week ago. She describes the hematuria as being intermittent initially. However, it is now progressed. She states that for the past 2 days she has noted a hematuria with every void. She describes her urine is being pain could color. She denies passage of clot material or difficulties voiding. She denies recent passage of stone material. She denies any unilateral flank pain or abdominal pain. She denies dysuria or exacerbation of voiding symptoms, as well. No nausea, vomiting, fever, or chills. Most recent KUB from approximately 4 weeks ago revealed renal calculi, however there was no definitive calculi noted along the course of bilateral ureters. She is not on blood thinners. No smoking hx.   08/18/19: Patient with below noted hx. She returns today for follow up. She denies interval passage of stone material, but is doing well today. She states that she did have one episode of pain in her RLQ with associated nausea about 2 weeks ago. This latest briefly and subsided. She denies any current flank or abdominal pain. She denies exacerbation of voiding symptoms, difficulties voiding, or gross hematuria. No fevers or  chills. No nausea or vomiting. She had no hydro on RUS at prior OV.   07/24/19: Patient with below noted history. KUB at prior office visit showed stable right renal calculus, but there was a questionable 2-3 mm right proximal opacity noted. She was not having any symptoms at the time. Therefore, she was brought back today for follow-up evaluation and repeat imaging studies. She denies any interval passage of stone material. She denies any current or interval episodes of flank pain. However, she states that she has had some pelvic pressure and discomfort. The most recent event of this was over the weekend. She described this as mild the and she did not have to use any pain medication for management. She feels that the strength of her stream has also decreased. She denies any difficulties voiding and otherwise feels she is voiding at baseline. No complaints of dysuria, gross hematuria, fever, chills, nausea, or vomiting.   06/28/19: Mary Barnett returns today in f/u. She has a history of stones and in the summer of last year she had nausea and diarrhea with some hematuria. She wasn't able to come because she was caring for her husband who died of lung cancer in 03-01-23. Her symptoms have resolved and her UA is clear today. Her prior stones were calcium and she had an ESWL for a left UPJ stone on 07/21/18.    06/10/18: Patient with prior history of renal stones, with most recent event requiring ESWL in 08/2016. She presents today with complaints of suprapubic pressure. She states that she had severe episode of suprapubic pressure associated with nausea about 2 days ago. Since then  the pain has subsided, but she continue to have intermittent discomfort. She denies unilateral flank pain. No exacerbation of voiding symptoms, gross hematuria, dysuria, fevers, or chills. She denies interval episodes of nausea or vomiting.   Jul 01, 2018: Patient with above noted history. At prior OV, there were no definitive stones noted on KUB and  there was no overt hydronephrosis. Today, she returns for follow up. She has been relatively asymptomatic since prior OV, but has had some intermittent episodes of discomfort in her right flank. She denies exacerbation of voiding symptoms, gross hematuria, fevers, chills, nausea, or vomiting. She has not seen a stone pass.   07/18/18: Mary Barnett returns in f/u for a left UPJ stone. She had some hematuria last Weds but has had no pain. She has had no voiding complaints, nausea or fever. A KUB today shows no change in the 3 mm left proximal stone. There is a stable RUP stone as well.     ALLERGIES: Cortisone - Other Reaction, facial flushing    MEDICATIONS: Calcium Citrate- Vitamin D 315 mg calcium-6.25 mcg (250 unit) tablet Oral  Losartan Potassium 100 mg tablet Oral  Simvastatin 20 mg tablet Oral     GU PSH: ESWL, Left - 07/21/2018, 2018, 2016 Hysterectomy Unilat SO - 2016     NON-GU PSH: Back Surgery (Unspecified) Colonoscopy - 2000 Knee Arthroscopy/surgery Repair/graft Femur Head/neck     GU PMH: Ureteral calculus - 09/18/2019, - 08/18/2019, She may have a 2-3 mm right proximal stone but has no symptoms currently. I will have her return in 3 weeks with a KUB and renal US for reevaluation. , - 06/28/2019, Her stone hasn't moved. I discussed the options of continued MET, URS and ESWL and will get her set up for ESWL. I reviewed the risks of ESWL including bleeding, infection, injury to the kidney or adjacent structures, failure to fragment the stone, need for ancillary procedures, thrombotic events, cardiac arrhythmias and sedation complications. , - 07/18/2018, - 2017 Renal calculus - 07/01/18, She has passed her treated stone and has stable renal stones. her residual pain is probably musculoskeletal and I recommended she see a chiropractor. , - 2018, Right nephrolithiasis, - 2017 Microscopic hematuria - 06/10/2018 Flank Pain (Acute), Left, Secondary to left UPJ calculus. - 2018 Gross hematuria  (Acute), Secondary to left UPJ calculus. - 2018, Gross hematuria, - 2017 Dorsalgia, Unspec, Back pain - 2017 Urinary Tract Inf, Unspec site, Suspected urinary tract infection - 2017 Abdominal Pain Unspec, Right flank pain - 2016 History of urolithiasis, History of renal calculi - 2016    NON-GU PMH: Nausea, Nausea - 2017 Encounter for general adult medical examination without abnormal findings, Encounter for preventive health examination - 2016 Personal history of other diseases of the circulatory system, History of hypertension - 2016 Personal history of other diseases of the musculoskeletal system and connective tissue, History of arthritis - 2016 Personal history of other endocrine, nutritional and metabolic disease, History of hypercholesterolemia - 2016    FAMILY HISTORY: 1 Daughter - Runs in Family Hematuria - Runs In Family Kidney Stones - Runs In Family Lung Cancer - Runs In Family   SOCIAL HISTORY: Marital Status: Married Preferred Language: English; Ethnicity: Not Hispanic Or Latino; Race: White Current Smoking Status: Patient has never smoked.   Tobacco Use Assessment Completed: Used Tobacco in last 30 days? Does not use smokeless tobacco. Does not drink anymore.  Does not use drugs. Drinks 1 caffeinated drink per day. Has had a blood transfusion.    REVIEW  OF SYSTEMS:    GU Review Female:   Patient denies frequent urination, hard to postpone urination, burning /pain with urination, get up at night to urinate, leakage of urine, stream starts and stops, trouble starting your stream, have to strain to urinate, and being pregnant.  Gastrointestinal (Upper):   Patient denies nausea, vomiting, and indigestion/ heartburn.  Gastrointestinal (Lower):   Patient denies diarrhea and constipation.  Constitutional:   Patient denies night sweats, fatigue, weight loss, and fever.  Skin:   Patient denies skin rash/ lesion and itching.  Eyes:   Patient denies blurred vision and double  vision.  Ears/ Nose/ Throat:   Patient denies sore throat and sinus problems.  Hematologic/Lymphatic:   Patient denies swollen glands and easy bruising.  Cardiovascular:   Patient denies leg swelling and chest pains.  Respiratory:   Patient denies cough and shortness of breath.  Endocrine:   Patient denies excessive thirst.  Musculoskeletal:   Patient denies back pain and joint pain.  Neurological:   Patient denies headaches and dizziness.  Psychologic:   Patient denies depression and anxiety.   VITAL SIGNS:      11/24/2019 08:17 AM  Weight 144 lb / 65.32 kg  Height 66 in / 167.64 cm  BP 122/74 mmHg  Heart Rate 75 /min  Temperature 98.7 F / 37.0 C  BMI 23.2 kg/m   MULTI-SYSTEM PHYSICAL EXAMINATION:    Constitutional: Well-nourished. No physical deformities. Normally developed. Good grooming.  Respiratory: No labored breathing, no use of accessory muscles.   Neurologic / Psychiatric: Oriented to time, oriented to place, oriented to person. No depression, no anxiety, no agitation.  Musculoskeletal: Normal gait and station of head and neck.     Complexity of Data:  Records Review:   Previous Patient Records  Urine Test Review:   Urinalysis, Urine Culture  X-Ray Review: C.T. Abdomen/Pelvis: Reviewed Films.     PROCEDURES:         KUB - [id number]  A single view of the abdomen is obtained. Stable opacities noted within the confines of bilateral renal shadow, with the largest measuring approximatly 9 mm in the vicinity of the right upper pole. No obvious opacities noted along the expected anatomical course of bilateral ureters. Clips noted her in right pelvis. Normal appearance of overlying bowel gas pattern.       Patient confirmed No Neulasta OnPro Device.            Urinalysis Dipstick Dipstick Cont'd  Color: Yellow Bilirubin: Neg mg/dL  Appearance: Clear Ketones: Neg mg/dL  Specific Gravity: 8.189 Blood: Neg ery/uL  pH: 6.0 Protein: Neg mg/dL  Glucose: Neg mg/dL  Urobilinogen: 0.2 mg/dL    Nitrites: Neg    Leukocyte Esterase: Neg leu/uL    ASSESSMENT:      ICD-10 Details  1 GU:   Ureteral calculus - N20.1   2   Gross hematuria - R31.0    PLAN:           Orders X-Rays: KUB  X-Ray Notes: History:  Hematuria: Yes/No  Patient to see MD after exam: Yes/No  Previous exam: CT / IVP/ US/ KUB/ None  When:  Where:  Diabetic: Yes/ No  BUN/ Creatinine:  Date of last BUN Creatinine:  Weight in pounds:  Allergy- IV Contrast: Yes/ No  Conflicting diabetic meds: Yes/ No  Diabetic Meds:  Prior Authorization #:            Schedule         Document  Letter(s):  Created for Patient: Clinical Summary         Notes:   Urinalysis today is without infectious parameters or hematuria. Overall, she has been doing well since prior OV. However, she has had some interval episodes of gross hematuria. KUB imaging today, there are stable opacities noted within the confines of bilateral renal shadow, with the largest measuring approximatly 9 mm in the vicinity of the right upper pole. No obvious opacities noted along the expected anatomical course of bilateral ureters. She is interested in considering treatment of RUP calculus. We reviewed treatment options today in detail and she is most interested in ESWL. We reviewed indications and potential risks. She has had this procedure before and is aware. Ultimately, I will review today's imaging with her urologist for recommendations moving forward and keep her updated. General stone prevention strategies discussed. Follow up will be pending. Return precuations reviewed.      Signed by Azucena Fallen, NP on 11/27/19 at 5:34 PM (EDT)       APPENDED NOTES:  Patient would like to proceed with right ESWL for treatment of right renal calculus. We reviewed the procedure and potential risks in detail today over the phone and she voiced understanding. Will try to plan to schedule in about one month. Case reviewed  with her urologist, as well.

## 2020-01-18 ENCOUNTER — Ambulatory Visit (HOSPITAL_BASED_OUTPATIENT_CLINIC_OR_DEPARTMENT_OTHER)
Admission: RE | Admit: 2020-01-18 | Discharge: 2020-01-18 | Disposition: A | Discharge status: Home / Self Care | Payer: Medicare Other | Source: Ambulatory Visit | Attending: Urology | Admitting: Urology

## 2020-01-18 ENCOUNTER — Encounter (HOSPITAL_BASED_OUTPATIENT_CLINIC_OR_DEPARTMENT_OTHER)
Admission: RE | Disposition: A | Discharge status: Home / Self Care | Payer: Self-pay | Source: Ambulatory Visit | Attending: Urology

## 2020-01-18 ENCOUNTER — Ambulatory Visit (HOSPITAL_COMMUNITY): Payer: Medicare Other

## 2020-01-18 ENCOUNTER — Encounter (HOSPITAL_BASED_OUTPATIENT_CLINIC_OR_DEPARTMENT_OTHER): Payer: Self-pay | Admitting: Urology

## 2020-01-18 ENCOUNTER — Other Ambulatory Visit: Payer: Self-pay

## 2020-01-18 DIAGNOSIS — I10 Essential (primary) hypertension: Secondary | ICD-10-CM | POA: Diagnosis not present

## 2020-01-18 DIAGNOSIS — R310 Gross hematuria: Secondary | ICD-10-CM | POA: Insufficient documentation

## 2020-01-18 DIAGNOSIS — Z79899 Other long term (current) drug therapy: Secondary | ICD-10-CM | POA: Insufficient documentation

## 2020-01-18 DIAGNOSIS — N200 Calculus of kidney: Secondary | ICD-10-CM | POA: Diagnosis not present

## 2020-01-18 DIAGNOSIS — Z87442 Personal history of urinary calculi: Secondary | ICD-10-CM | POA: Diagnosis not present

## 2020-01-18 DIAGNOSIS — E785 Hyperlipidemia, unspecified: Secondary | ICD-10-CM | POA: Diagnosis not present

## 2020-01-18 DIAGNOSIS — Z01818 Encounter for other preprocedural examination: Secondary | ICD-10-CM | POA: Diagnosis not present

## 2020-01-18 DIAGNOSIS — M47816 Spondylosis without myelopathy or radiculopathy, lumbar region: Secondary | ICD-10-CM | POA: Diagnosis not present

## 2020-01-18 DIAGNOSIS — Z888 Allergy status to other drugs, medicaments and biological substances status: Secondary | ICD-10-CM | POA: Diagnosis not present

## 2020-01-18 HISTORY — PX: EXTRACORPOREAL SHOCK WAVE LITHOTRIPSY: SHX1557

## 2020-01-18 SURGERY — LITHOTRIPSY, ESWL
Anesthesia: LOCAL | Laterality: Right

## 2020-01-18 MED ORDER — DIAZEPAM 5 MG PO TABS
ORAL_TABLET | ORAL | Status: AC
  Filled 2020-01-18: qty 2

## 2020-01-18 MED ORDER — SODIUM CHLORIDE 0.9 % IV SOLN: INTRAVENOUS | Status: DC

## 2020-01-18 MED ORDER — DIPHENHYDRAMINE HCL 25 MG PO CAPS
25.0000 mg | ORAL_CAPSULE | ORAL | Status: AC
  Administered 2020-01-18: 25 mg via ORAL

## 2020-01-18 MED ORDER — DIAZEPAM 5 MG PO TABS
10.0000 mg | ORAL_TABLET | ORAL | Status: AC
  Administered 2020-01-18: 10 mg via ORAL

## 2020-01-18 MED ORDER — CIPROFLOXACIN HCL 500 MG PO TABS
ORAL_TABLET | ORAL | Status: AC
  Filled 2020-01-18: qty 1

## 2020-01-18 MED ORDER — TRAMADOL HCL 50 MG PO TABS: 50.0000 mg | ORAL_TABLET | Freq: Four times a day (QID) | ORAL | 0 refills | Status: AC | PRN

## 2020-01-18 MED ORDER — CIPROFLOXACIN HCL 500 MG PO TABS
500.0000 mg | ORAL_TABLET | ORAL | Status: AC
  Administered 2020-01-18: 500 mg via ORAL

## 2020-01-18 MED ORDER — DIPHENHYDRAMINE HCL 25 MG PO CAPS
ORAL_CAPSULE | ORAL | Status: AC
  Filled 2020-01-18: qty 1

## 2020-01-18 NOTE — Discharge Instructions (Signed)
Lithotripsy, Care After This sheet gives you information about how to care for yourself after your procedure. Your health care provider may also give you more specific instructions. If you have problems or questions, contact your health care provider. What can I expect after the procedure? After the procedure, it is common to have:  Some blood in your urine. This should only last for a few days.  Soreness in your back, sides, or upper abdomen for a few days.  Blotches or bruises on your back where the pressure wave entered the skin.  Pain, discomfort, or nausea when pieces (fragments) of the kidney stone move through the tube that carries urine from the kidney to the bladder (ureter). Stone fragments may pass soon after the procedure, but they may continue to pass for up to 4-8 weeks. ? If you have severe pain or nausea, contact your health care provider. This may be caused by a large stone that was not broken up, and this may mean that you need more treatment.  Some pain or discomfort during urination.  Some pain or discomfort in the lower abdomen or (in men) at the base of the penis. Follow these instructions at home: Medicines  Take over-the-counter and prescription medicines only as told by your health care provider.  If you were prescribed an antibiotic medicine, take it as told by your health care provider. Do not stop taking the antibiotic even if you start to feel better.  Do not drive for 24 hours if you were given a medicine to help you relax (sedative).  Do not drive or use heavy machinery while taking prescription pain medicine. Eating and drinking      Drink enough water and fluids to keep your urine clear or pale yellow. This helps any remaining pieces of the stone to pass. It can also help prevent new stones from forming.  Eat plenty of fresh fruits and vegetables.  Follow instructions from your health care provider about eating and drinking restrictions. You may be  instructed: ? To reduce how much salt (sodium) you eat or drink. Check ingredients and nutrition facts on packaged foods and beverages. ? To reduce how much meat you eat.  Eat the recommended amount of calcium for your age and gender. Ask your health care provider how much calcium you should have. General instructions  Get plenty of rest.  Most people can resume normal activities 1-2 days after the procedure. Ask your health care provider what activities are safe for you.  Your health care provider may direct you to lie in a certain position (postural drainage) and tap firmly (percuss) over your kidney area to help stone fragments pass. Follow instructions as told by your health care provider.  If directed, strain all urine through the strainer that was provided by your health care provider. ? Keep all fragments for your health care provider to see. Any stones that are found may be sent to a medical lab for examination. The stone may be as small as a grain of salt.  Keep all follow-up visits as told by your health care provider. This is important. Contact a health care provider if:  You have pain that is severe or does not get better with medicine.  You have nausea that is severe or does not go away.  You have blood in your urine longer than your health care provider told you to expect.  You have more blood in your urine.  You have pain during urination that does   not go away.  You urinate more frequently than usual and this does not go away.  You develop a rash or any other possible signs of an allergic reaction. Get help right away if:  You have severe pain in your back, sides, or upper abdomen.  You have severe pain while urinating.  Your urine is very dark red.  You have blood in your stool (feces).  You cannot pass any urine at all.  You feel a strong urge to urinate after emptying your bladder.  You have a fever or chills.  You develop shortness of breath,  difficulty breathing, or chest pain.  You have severe nausea that leads to persistent vomiting.  You faint. Summary  After this procedure, it is common to have some pain, discomfort, or nausea when pieces (fragments) of the kidney stone move through the tube that carries urine from the kidney to the bladder (ureter). If this pain or nausea is severe, however, you should contact your health care provider.  Most people can resume normal activities 1-2 days after the procedure. Ask your health care provider what activities are safe for you.  Drink enough water and fluids to keep your urine clear or pale yellow. This helps any remaining pieces of the stone to pass, and it can help prevent new stones from forming.  If directed, strain your urine and keep all fragments for your health care provider to see. Fragments or stones may be as small as a grain of salt.  Get help right away if you have severe pain in your back, sides, or upper abdomen or have severe pain while urinating. This information is not intended to replace advice given to you by your health care provider. Make sure you discuss any questions you have with your health care provider. Document Revised: 07/25/2018 Document Reviewed: 03/04/2016 Elsevier Patient Education  2020 [employer]    Post Anesthesia Home Care Instructions  Activity: Get plenty of rest for the remainder of the day. A responsible individual must stay with you for 24 hours following the procedure.  For the next 24 hours, DO NOT: -Drive a car -Operate machinery -Drink alcoholic beverages -Take any medication unless instructed by your physician -Make any legal decisions or sign important papers.  Meals: Start with liquid foods such as gelatin or soup. Progress to regular foods as tolerated. Avoid greasy, spicy, heavy foods. If nausea and/or vomiting occur, drink only clear liquids until the nausea and/or vomiting subsides. Call your physician if vomiting  continues.  Special Instructions/Symptoms: Your throat may feel dry or sore from the anesthesia or the breathing tube placed in your throat during surgery. If this causes discomfort, gargle with warm salt water. The discomfort should disappear within 24 hours.  If you had a scopolamine patch placed behind your ear for the management of post- operative nausea and/or vomiting:  1. The medication in the patch is effective for 72 hours, after which it should be removed.  Wrap patch in a tissue and discard in the trash. Wash hands thoroughly with soap and water. 2. You may remove the patch earlier than 72 hours if you experience unpleasant side effects which may include dry mouth, dizziness or visual disturbances. 3. Avoid touching the patch. Wash your hands with soap and water after contact with the patch.     

## 2020-01-18 NOTE — Interval H&P Note (Signed)
History and Physical Interval Note:  01/18/2020 1:00 PM  Mary Barnett  has presented today for surgery, with the diagnosis of RIGHT RENAL STONE.  The various methods of treatment have been discussed with the patient and family. After consideration of risks, benefits and other options for treatment, the patient has consented to  Procedure(s): RIGHT EXTRACORPOREAL SHOCK WAVE LITHOTRIPSY (ESWL) (Right) as a surgical intervention.  The patient's history has been reviewed, patient examined, no change in status, stable for surgery.  I have reviewed the patient's chart and labs.  Questions were answered to the patient's satisfaction.     Remi Haggard

## 2020-01-19 ENCOUNTER — Encounter (HOSPITAL_BASED_OUTPATIENT_CLINIC_OR_DEPARTMENT_OTHER): Payer: Self-pay | Admitting: Urology

## 2020-01-22 DIAGNOSIS — M948X8 Other specified disorders of cartilage, other site: Secondary | ICD-10-CM | POA: Diagnosis not present

## 2020-01-22 DIAGNOSIS — R222 Localized swelling, mass and lump, trunk: Secondary | ICD-10-CM | POA: Diagnosis not present

## 2020-01-22 DIAGNOSIS — Z6823 Body mass index (BMI) 23.0-23.9, adult: Secondary | ICD-10-CM | POA: Diagnosis not present

## 2020-01-31 DIAGNOSIS — N202 Calculus of kidney with calculus of ureter: Secondary | ICD-10-CM | POA: Diagnosis not present

## 2020-02-02 DIAGNOSIS — Z23 Encounter for immunization: Secondary | ICD-10-CM | POA: Diagnosis not present

## 2020-02-08 DIAGNOSIS — M19071 Primary osteoarthritis, right ankle and foot: Secondary | ICD-10-CM | POA: Diagnosis not present

## 2020-02-08 DIAGNOSIS — M25571 Pain in right ankle and joints of right foot: Secondary | ICD-10-CM | POA: Diagnosis not present

## 2020-02-08 DIAGNOSIS — M2011 Hallux valgus (acquired), right foot: Secondary | ICD-10-CM | POA: Diagnosis not present

## 2020-02-15 DIAGNOSIS — N200 Calculus of kidney: Secondary | ICD-10-CM | POA: Diagnosis not present

## 2020-02-29 DIAGNOSIS — Z23 Encounter for immunization: Secondary | ICD-10-CM | POA: Diagnosis not present

## 2020-03-05 DIAGNOSIS — N201 Calculus of ureter: Secondary | ICD-10-CM | POA: Diagnosis not present

## 2020-03-05 DIAGNOSIS — N202 Calculus of kidney with calculus of ureter: Secondary | ICD-10-CM | POA: Diagnosis not present

## 2020-03-25 DIAGNOSIS — K439 Ventral hernia without obstruction or gangrene: Secondary | ICD-10-CM | POA: Diagnosis not present

## 2020-03-26 DIAGNOSIS — R310 Gross hematuria: Secondary | ICD-10-CM | POA: Diagnosis not present

## 2020-03-26 DIAGNOSIS — N201 Calculus of ureter: Secondary | ICD-10-CM | POA: Diagnosis not present

## 2020-04-02 ENCOUNTER — Other Ambulatory Visit: Payer: Self-pay | Admitting: Urology

## 2020-04-08 ENCOUNTER — Other Ambulatory Visit (HOSPITAL_COMMUNITY)
Admission: RE | Admit: 2020-04-08 | Discharge: 2020-04-08 | Disposition: A | Discharge status: Home / Self Care | Payer: Medicare Other | Source: Ambulatory Visit | Attending: Urology | Admitting: Urology

## 2020-04-08 DIAGNOSIS — Z20822 Contact with and (suspected) exposure to covid-19: Secondary | ICD-10-CM | POA: Diagnosis not present

## 2020-04-08 DIAGNOSIS — Z01812 Encounter for preprocedural laboratory examination: Secondary | ICD-10-CM | POA: Insufficient documentation

## 2020-04-08 LAB — SARS CORONAVIRUS 2 (TAT 6-24 HRS): SARS Coronavirus 2: NEGATIVE

## 2020-04-08 NOTE — Progress Notes (Signed)
Patient to arrive at 1200 on 04/11/2020. History and medications reviewed. Pre-procedure instructions given. NPO after MN on Wednesday except for clear liquids until 1000. Driver secured.

## 2020-04-09 NOTE — H&P (Signed)
I have kidney stones.  HPI: Mary Barnett is a 69 year-old female established patient who is here for renal calculi.  03/26/20: Patient with below noted history. She returns today for ongoing evaluation. On KUB a prior office visit she was noted to have an approximately 4 x 5 mm opacity along the expected anatomical course of the right distal ureter. There was no overt hydronephrosis noted on ultrasound imaging. Today she denies interval passage of stone material. She currently remains asymptomatic. She denies interval episodes of flank or abdominal pain. She is voiding at baseline. She did have some hematuria last week, but this is now resolved. She denies any dysuria. No complaints of fever, chills, nausea, or vomiting.   03/05/20: She returns today for follow up. KUB at prior office visit showed good fragmentation of stone treated with ESWL. However, a residual fragment was noted within the confines of the right renal shadow. Today she denies any interval passage of stone material. Overall, she continues to do well. No complaints of current or interval episodes of flank or abdominal pain. She denies gross hematuria, dysuria, or exacerbation of voiding symptoms. No complaints of fever, chills, nausea, or vomiting. Stone composition was noted to be 80% calcium oxalate and 20% uric acid.   01/31/20: Patient with below noted hx. She is s/p right ESWL on 9/23. She states that she has been doing well following her procedure. She is passed multiple stone fragments. She denies any current flank or abdominal pain. No complaints of dysuria, gross hematuria, or exacerbation of voiding symptoms. She denies fever, chills, nausea, or vomiting. She states that she most recently passed stone material approximately 2 days ago.   11/24/19: Patient with below noted hx. She returns today for follow up. Urine culture at prior OV was noted to be positive for Enterococcus and she was treated accordingly. She underwent CT imaging,  which revealed bilateral non obstructive calculi. She denies interval passage of stone material. She noted an episode of gross hematuria about two weeks ago and then had another episode yesterday. She denies any current flank or abdominal pain. No complaints of dysuria or exacerbation of voiding symptoms. She denies fever, chills, nausea, or vomiting.   09/18/19: Patient with below noted history. She presents today with complaints of gross hematuria. She states that this initially began about 1 week ago. She describes the hematuria as being intermittent initially. However, it is now progressed. She states that for the past 2 days she has noted a hematuria with every void. She describes her urine is being pain could color. She denies passage of clot material or difficulties voiding. She denies recent passage of stone material. She denies any unilateral flank pain or abdominal pain. She denies dysuria or exacerbation of voiding symptoms, as well. No nausea, vomiting, fever, or chills. Most recent KUB from approximately 4 weeks ago revealed renal calculi, however there was no definitive calculi noted along the course of bilateral ureters. She is not on blood thinners. No smoking hx.   08/18/19: Patient with below noted hx. She returns today for follow up. She denies interval passage of stone material, but is doing well today. She states that she did have one episode of pain in her RLQ with associated nausea about 2 weeks ago. This latest briefly and subsided. She denies any current flank or abdominal pain. She denies exacerbation of voiding symptoms, difficulties voiding, or gross hematuria. No fevers or chills. No nausea or vomiting. She had no hydro on RUS at prior  OV.   07/24/19: Patient with below noted history. KUB at prior office visit showed stable right renal calculus, but there was a questionable 2-3 mm right proximal opacity noted. She was not having any symptoms at the time. Therefore, she was brought  back today for follow-up evaluation and repeat imaging studies. She denies any interval passage of stone material. She denies any current or interval episodes of flank pain. However, she states that she has had some pelvic pressure and discomfort. The most recent event of this was over the weekend. She described this as mild the and she did not have to use any pain medication for management. She feels that the strength of her stream has also decreased. She denies any difficulties voiding and otherwise feels she is voiding at baseline. No complaints of dysuria, gross hematuria, fever, chills, nausea, or vomiting.   06/28/19: Mary Barnett returns today in f/u. She has a history of stones and in the summer of last year she had nausea and diarrhea with some hematuria. She wasn't able to come because she was caring for her husband who died of lung cancer in 2023/02/24. Her symptoms have resolved and her UA is clear today. Her prior stones were calcium and she had an ESWL for a left UPJ stone on 07/21/18.    06/10/18: Patient with prior history of renal stones, with most recent event requiring ESWL in 08/2016. She presents today with complaints of suprapubic pressure. She states that she had severe episode of suprapubic pressure associated with nausea about 2 days ago. Since then the pain has subsided, but she continue to have intermittent discomfort. She denies unilateral flank pain. No exacerbation of voiding symptoms, gross hematuria, dysuria, fevers, or chills. She denies interval episodes of nausea or vomiting.   06/22/18: Patient with above noted history. At prior OV, there were no definitive stones noted on KUB and there was no overt hydronephrosis. Today, she returns for follow up. She has been relatively asymptomatic since prior OV, but has had some intermittent episodes of discomfort in her right flank. She denies exacerbation of voiding symptoms, gross hematuria, fevers, chills, nausea, or vomiting. She has not seen a  stone pass.   07/18/18: Mary Barnett returns in f/u for a left UPJ stone. She had some hematuria last Weds but has had no pain. She has had no voiding complaints, nausea or fever. A KUB today shows no change in the 3 mm left proximal stone. There is a stable RUP stone as well.     ALLERGIES: Cortisone - Other Reaction, facial flushing    MEDICATIONS: Tamsulosin Hcl 0.4 mg capsule 1 capsule PO Daily     GU PSH: ESWL, Right - 01/18/2020, Left - 06/26/2018, Jun 26, 2016, Jun 26, 2014 Hysterectomy Unilat SO - 2014/06/26     NON-GU PSH: Back Surgery (Unspecified) Colonoscopy - Jun 26, 1998 Knee Arthroscopy/surgery Repair/graft Femur Head/neck     GU PMH: Renal calculus - 03/05/2020, - 02-17-20, 2018/06/26, She has passed her treated stone and has stable renal stones. her residual pain is probably musculoskeletal and I recommended she see a chiropractor. , 2016-06-26, Right nephrolithiasis, - 06/27/15 Ureteral calculus - 03/05/2020, - February 17, 2020, - 11/24/2019, - 09/18/2019, - 08/18/2019, She may have a 2-3 mm right proximal stone but has no symptoms currently. I will have her return in 3 weeks with a KUB and renal US for reevaluation. , - 06/28/2019, Her stone hasn't moved. I discussed the options of continued MET, URS and ESWL and will get her set up for ESWL.  I reviewed the risks of ESWL including bleeding, infection, injury to the kidney or adjacent structures, failure to fragment the stone, need for ancillary procedures, thrombotic events, cardiac arrhythmias and sedation complications. , - 2020, - 2017 Gross hematuria - 01/31/2020, (Acute), Secondary to left UPJ calculus. , - 2018, Gross hematuria, - 2017 Microscopic hematuria - 2020 Flank Pain (Acute), Left, Secondary to left UPJ calculus. - 2018 Dorsalgia, Unspec, Back pain - 2017 Urinary Tract Inf, Unspec site, Suspected urinary tract infection - 2017 Abdominal Pain Unspec, Right flank pain - 2016 History of urolithiasis, History of renal calculi - 2016    NON-GU PMH: Nausea, Nausea -  2017 Encounter for general adult medical examination without abnormal findings, Encounter for preventive health examination - 2016 Personal history of other diseases of the circulatory system, History of hypertension - 2016 Personal history of other diseases of the musculoskeletal system and connective tissue, History of arthritis - 2016 Personal history of other endocrine, nutritional and metabolic disease, History of hypercholesterolemia - 2016    FAMILY HISTORY: 1 Daughter - Runs in Family Death - Father Hematuria - Runs In Family Kidney Stones - Runs In Family Lung Cancer - Runs In Family   SOCIAL HISTORY: Marital Status: Married Preferred Language: English; Ethnicity: Not Hispanic Or Latino; Race: White Current Smoking Status: Patient has never smoked.   Tobacco Use Assessment Completed: Used Tobacco in last 30 days? Does not use smokeless tobacco. Does not drink anymore.  Does not use drugs. Drinks 1 caffeinated drink per day. Has had a blood transfusion. Patient's occupation is/was Retired.    REVIEW OF SYSTEMS:    GU Review Female:   Patient denies frequent urination, hard to postpone urination, burning /pain with urination, get up at night to urinate, leakage of urine, stream starts and stops, trouble starting your stream, have to strain to urinate, and being pregnant.  Gastrointestinal (Upper):   Patient denies nausea, vomiting, and indigestion/ heartburn.  Gastrointestinal (Lower):   Patient denies diarrhea and constipation.  Constitutional:   Patient denies fever, night sweats, weight loss, and fatigue.  Skin:   Patient denies itching and skin rash/ lesion.  Eyes:   Patient denies blurred vision and double vision.  Ears/ Nose/ Throat:   Patient denies sore throat and sinus problems.  Hematologic/Lymphatic:   Patient denies swollen glands and easy bruising.  Cardiovascular:   Patient denies leg swelling and chest pains.  Respiratory:   Patient denies cough and shortness  of breath.  Endocrine:   Patient denies excessive thirst.  Musculoskeletal:   Patient denies back pain and joint pain.  Neurological:   Patient denies headaches and dizziness.  Psychologic:   Patient denies depression and anxiety.   VITAL SIGNS:      03/26/2020 10:08 AM  Weight 145 lb / 65.77 kg  Height 66 in / 167.64 cm  BP 148/89 mmHg  Pulse 77 /min  Temperature 98.0 F / 36.6 C  BMI 23.4 kg/m   MULTI-SYSTEM PHYSICAL EXAMINATION:    Constitutional: Well-nourished. No physical deformities. Normally developed. Good grooming.  Respiratory: Normal breath sounds. No labored breathing, no use of accessory muscles.   Cardiovascular: Regular rate and rhythm. No murmur, no gallop. Normal temperature, normal extremity pulses, no swelling, no varicosities.   Neurologic / Psychiatric: Oriented to time, oriented to place, oriented to person. No depression, no anxiety, no agitation.  Musculoskeletal: Normal gait and station of head and neck.     Complexity of Data:  Records Review:   Previous  Patient Records  Urine Test Review:   Urinalysis, Urine Culture  X-Ray Review: KUB: Reviewed Films.  Renal Ultrasound: Reviewed Films.     PROCEDURES:         KUB - [id number]  A single view of the abdomen is obtained. Stable appearing approximately 2-3 mm opacity noted within the confines of the left renal shadow in the vicinity of the upper pole. There continues to be an opacity noted along the expected anatomical course of the right distal ureter measuring approximately 4 x 5 mm consistent with a residual stone fragment. This does not appear to have progressed from prior. Normal appearance of overlying bowel gas pattern. Clips noted in the pelvis.       Patient confirmed No Neulasta OnPro Device.           Urinalysis - 81003 Dipstick Dipstick Cont'd  Color: Yellow Bilirubin: Neg mg/dL  Appearance: Clear Ketones: Neg mg/dL  Specific Gravity: 1.025 Blood: Neg ery/uL  pH: 5.5 Protein: Neg mg/dL   Glucose: Neg mg/dL Urobilinogen: 0.2 mg/dL    Nitrites: Neg    Leukocyte Esterase: Neg leu/uL    ASSESSMENT:      ICD-10 Details  1 GU:   Ureteral calculus - N20.1   2   Gross hematuria - R31.0    PLAN:           Orders Labs Urine Culture          Document Letter(s):  Created for Patient: Clinical Summary         Notes:   Urinalysis today is not overtly concerning for infectious process, but precautionary culture was sent. On KUB imaging, there continues to be an opacity noted along the expected anatomical course of the right distal ureter measuring approximately 4 x 5 mm consistent with a residual stone fragment. This does not appear to have progressed from prior. Overall, she continues to do well and is fairly asymptomatic. Ultimately, I am going to review most recent imaging with her urologist for recommendations moving forward. I will keep her updated. She will continue medical expulsive therapy in the interim. We discussed ongoing hydration and remaining active. Return precautions reviewed in the interim for fevers or refractory symptoms.

## 2020-04-11 ENCOUNTER — Encounter (HOSPITAL_BASED_OUTPATIENT_CLINIC_OR_DEPARTMENT_OTHER)
Admission: RE | Disposition: A | Discharge status: Home / Self Care | Payer: Self-pay | Source: Home / Self Care | Attending: Urology

## 2020-04-11 ENCOUNTER — Ambulatory Visit (HOSPITAL_COMMUNITY): Payer: Medicare Other

## 2020-04-11 ENCOUNTER — Ambulatory Visit (HOSPITAL_BASED_OUTPATIENT_CLINIC_OR_DEPARTMENT_OTHER)
Admission: RE | Admit: 2020-04-11 | Discharge: 2020-04-11 | Disposition: A | Discharge status: Home / Self Care | Payer: Medicare Other | Attending: Urology | Admitting: Urology

## 2020-04-11 ENCOUNTER — Other Ambulatory Visit: Payer: Self-pay

## 2020-04-11 ENCOUNTER — Encounter (HOSPITAL_BASED_OUTPATIENT_CLINIC_OR_DEPARTMENT_OTHER): Payer: Self-pay | Admitting: Urology

## 2020-04-11 DIAGNOSIS — Z87442 Personal history of urinary calculi: Secondary | ICD-10-CM | POA: Diagnosis not present

## 2020-04-11 DIAGNOSIS — Z801 Family history of malignant neoplasm of trachea, bronchus and lung: Secondary | ICD-10-CM | POA: Diagnosis not present

## 2020-04-11 DIAGNOSIS — N201 Calculus of ureter: Secondary | ICD-10-CM

## 2020-04-11 DIAGNOSIS — N200 Calculus of kidney: Secondary | ICD-10-CM | POA: Diagnosis not present

## 2020-04-11 HISTORY — PX: EXTRACORPOREAL SHOCK WAVE LITHOTRIPSY: SHX1557

## 2020-04-11 SURGERY — LITHOTRIPSY, ESWL
Anesthesia: LOCAL | Laterality: Right

## 2020-04-11 MED ORDER — DIAZEPAM 5 MG PO TABS
ORAL_TABLET | ORAL | Status: AC
  Filled 2020-04-11: qty 2

## 2020-04-11 MED ORDER — SODIUM CHLORIDE 0.9% FLUSH: 3.0000 mL | Freq: Two times a day (BID) | INTRAVENOUS | Status: DC

## 2020-04-11 MED ORDER — DIPHENHYDRAMINE HCL 25 MG PO CAPS
ORAL_CAPSULE | ORAL | Status: AC
  Filled 2020-04-11: qty 1

## 2020-04-11 MED ORDER — DIPHENHYDRAMINE HCL 25 MG PO CAPS
25.0000 mg | ORAL_CAPSULE | ORAL | Status: AC
  Administered 2020-04-11: 25 mg via ORAL

## 2020-04-11 MED ORDER — CIPROFLOXACIN HCL 500 MG PO TABS
500.0000 mg | ORAL_TABLET | ORAL | Status: AC
  Administered 2020-04-11: 500 mg via ORAL

## 2020-04-11 MED ORDER — CIPROFLOXACIN HCL 500 MG PO TABS
ORAL_TABLET | ORAL | Status: AC
  Filled 2020-04-11: qty 1

## 2020-04-11 MED ORDER — SODIUM CHLORIDE 0.9 % IV SOLN: INTRAVENOUS | Status: DC

## 2020-04-11 MED ORDER — DIAZEPAM 5 MG PO TABS
10.0000 mg | ORAL_TABLET | ORAL | Status: AC
  Administered 2020-04-11: 10 mg via ORAL

## 2020-04-11 NOTE — Discharge Instructions (Signed)
Lithotripsy, Care After This sheet gives you information about how to care for yourself after your procedure. Your health care provider may also give you more specific instructions. If you have problems or questions, contact your health care provider. What can I expect after the procedure? After the procedure, it is common to have:  Some blood in your urine. This should only last for a few days.  Soreness in your back, sides, or upper abdomen for a few days.  Blotches or bruises on your back where the pressure wave entered the skin.  Pain, discomfort, or nausea when pieces (fragments) of the kidney stone move through the tube that carries urine from the kidney to the bladder (ureter). Stone fragments may pass soon after the procedure, but they may continue to pass for up to 4-8 weeks. ? If you have severe pain or nausea, contact your health care provider. This may be caused by a large stone that was not broken up, and this may mean that you need more treatment.  Some pain or discomfort during urination.  Some pain or discomfort in the lower abdomen or (in men) at the base of the penis. Follow these instructions at home: Medicines  Take over-the-counter and prescription medicines only as told by your health care provider.  If you were prescribed an antibiotic medicine, take it as told by your health care provider. Do not stop taking the antibiotic even if you start to feel better.  Do not drive for 24 hours if you were given a medicine to help you relax (sedative).  Do not drive or use heavy machinery while taking prescription pain medicine. Eating and drinking      Drink enough water and fluids to keep your urine clear or pale yellow. This helps any remaining pieces of the stone to pass. It can also help prevent new stones from forming.  Eat plenty of fresh fruits and vegetables.  Follow instructions from your health care provider about eating and drinking restrictions. You may be  instructed: ? To reduce how much salt (sodium) you eat or drink. Check ingredients and nutrition facts on packaged foods and beverages. ? To reduce how much meat you eat.  Eat the recommended amount of calcium for your age and gender. Ask your health care provider how much calcium you should have. General instructions  Get plenty of rest.  Most people can resume normal activities 1-2 days after the procedure. Ask your health care provider what activities are safe for you.  Your health care provider may direct you to lie in a certain position (postural drainage) and tap firmly (percuss) over your kidney area to help stone fragments pass. Follow instructions as told by your health care provider.  If directed, strain all urine through the strainer that was provided by your health care provider. ? Keep all fragments for your health care provider to see. Any stones that are found may be sent to a medical lab for examination. The stone may be as small as a grain of salt.  Keep all follow-up visits as told by your health care provider. This is important. Contact a health care provider if:  You have pain that is severe or does not get better with medicine.  You have nausea that is severe or does not go away.  You have blood in your urine longer than your health care provider told you to expect.  You have more blood in your urine.  You have pain during urination that does   not go away.  You urinate more frequently than usual and this does not go away.  You develop a rash or any other possible signs of an allergic reaction. Get help right away if:  You have severe pain in your back, sides, or upper abdomen.  You have severe pain while urinating.  Your urine is very dark red.  You have blood in your stool (feces).  You cannot pass any urine at all.  You feel a strong urge to urinate after emptying your bladder.  You have a fever or chills.  You develop shortness of breath,  difficulty breathing, or chest pain.  You have severe nausea that leads to persistent vomiting.  You faint. Summary  After this procedure, it is common to have some pain, discomfort, or nausea when pieces (fragments) of the kidney stone move through the tube that carries urine from the kidney to the bladder (ureter). If this pain or nausea is severe, however, you should contact your health care provider.  Most people can resume normal activities 1-2 days after the procedure. Ask your health care provider what activities are safe for you.  Drink enough water and fluids to keep your urine clear or pale yellow. This helps any remaining pieces of the stone to pass, and it can help prevent new stones from forming.  If directed, strain your urine and keep all fragments for your health care provider to see. Fragments or stones may be as small as a grain of salt.  Get help right away if you have severe pain in your back, sides, or upper abdomen or have severe pain while urinating. This information is not intended to replace advice given to you by your health care provider. Make sure you discuss any questions you have with your health care provider. Document Revised: 07/25/2018 Document Reviewed: 03/04/2016 Elsevier Patient Education  2020 [employer]  

## 2020-04-11 NOTE — Interval H&P Note (Signed)
History and Physical Interval Note:  No change in stone location.   04/11/2020 2:54 PM  Mary Barnett  has presented today for surgery, with the diagnosis of RIGHT DISTAL URETERAL STONE.  The various methods of treatment have been discussed with the patient and family. After consideration of risks, benefits and other options for treatment, the patient has consented to  Procedure(s): RIGHT EXTRACORPOREAL SHOCK WAVE LITHOTRIPSY (ESWL) (Right) as a surgical intervention.  The patient's history has been reviewed, patient examined, no change in status, stable for surgery.  I have reviewed the patient's chart and labs.  Questions were answered to the patient's satisfaction.     Irine Seal

## 2020-04-12 ENCOUNTER — Encounter (HOSPITAL_BASED_OUTPATIENT_CLINIC_OR_DEPARTMENT_OTHER): Payer: Self-pay | Admitting: Urology

## 2020-04-24 DIAGNOSIS — N202 Calculus of kidney with calculus of ureter: Secondary | ICD-10-CM | POA: Diagnosis not present

## 2020-04-24 DIAGNOSIS — N200 Calculus of kidney: Secondary | ICD-10-CM | POA: Diagnosis not present

## 2020-05-01 ENCOUNTER — Other Ambulatory Visit: Payer: Self-pay | Admitting: Urology

## 2020-05-21 ENCOUNTER — Other Ambulatory Visit (HOSPITAL_COMMUNITY)
Admission: RE | Admit: 2020-05-21 | Discharge: 2020-05-21 | Disposition: A | Discharge status: Home / Self Care | Payer: Medicare Other | Source: Ambulatory Visit | Attending: Urology | Admitting: Urology

## 2020-05-21 DIAGNOSIS — Z01812 Encounter for preprocedural laboratory examination: Secondary | ICD-10-CM | POA: Insufficient documentation

## 2020-05-21 DIAGNOSIS — Z20822 Contact with and (suspected) exposure to covid-19: Secondary | ICD-10-CM | POA: Insufficient documentation

## 2020-05-21 LAB — SARS CORONAVIRUS 2 (TAT 6-24 HRS): SARS Coronavirus 2: NEGATIVE

## 2020-05-22 DIAGNOSIS — N201 Calculus of ureter: Secondary | ICD-10-CM | POA: Diagnosis not present

## 2020-05-24 ENCOUNTER — Encounter (HOSPITAL_BASED_OUTPATIENT_CLINIC_OR_DEPARTMENT_OTHER): Admission: RE | Payer: Self-pay | Source: Home / Self Care

## 2020-05-24 ENCOUNTER — Ambulatory Visit (HOSPITAL_BASED_OUTPATIENT_CLINIC_OR_DEPARTMENT_OTHER): Admission: RE | Admit: 2020-05-24 | Payer: Medicare Other | Source: Home / Self Care | Admitting: Urology

## 2020-05-24 SURGERY — CYSTOSCOPY/URETEROSCOPY/HOLMIUM LASER/STENT PLACEMENT
Anesthesia: General | Laterality: Right

## 2020-06-28 ENCOUNTER — Other Ambulatory Visit: Payer: Self-pay | Admitting: Family Medicine

## 2020-06-28 DIAGNOSIS — Z1231 Encounter for screening mammogram for malignant neoplasm of breast: Secondary | ICD-10-CM

## 2020-07-31 DIAGNOSIS — H2513 Age-related nuclear cataract, bilateral: Secondary | ICD-10-CM | POA: Diagnosis not present

## 2020-07-31 DIAGNOSIS — H524 Presbyopia: Secondary | ICD-10-CM | POA: Diagnosis not present

## 2020-07-31 DIAGNOSIS — H43811 Vitreous degeneration, right eye: Secondary | ICD-10-CM | POA: Diagnosis not present

## 2020-07-31 DIAGNOSIS — H40023 Open angle with borderline findings, high risk, bilateral: Secondary | ICD-10-CM | POA: Diagnosis not present

## 2020-07-31 DIAGNOSIS — H31102 Choroidal degeneration, unspecified, left eye: Secondary | ICD-10-CM | POA: Diagnosis not present

## 2020-07-31 DIAGNOSIS — H16223 Keratoconjunctivitis sicca, not specified as Sjogren's, bilateral: Secondary | ICD-10-CM | POA: Diagnosis not present

## 2020-08-08 DIAGNOSIS — H524 Presbyopia: Secondary | ICD-10-CM | POA: Diagnosis not present

## 2020-08-08 DIAGNOSIS — H31102 Choroidal degeneration, unspecified, left eye: Secondary | ICD-10-CM | POA: Diagnosis not present

## 2020-08-08 DIAGNOSIS — H43811 Vitreous degeneration, right eye: Secondary | ICD-10-CM | POA: Diagnosis not present

## 2020-08-08 DIAGNOSIS — H2513 Age-related nuclear cataract, bilateral: Secondary | ICD-10-CM | POA: Diagnosis not present

## 2020-08-08 DIAGNOSIS — H16223 Keratoconjunctivitis sicca, not specified as Sjogren's, bilateral: Secondary | ICD-10-CM | POA: Diagnosis not present

## 2020-08-08 DIAGNOSIS — H40023 Open angle with borderline findings, high risk, bilateral: Secondary | ICD-10-CM | POA: Diagnosis not present

## 2020-08-28 ENCOUNTER — Ambulatory Visit
Admission: RE | Admit: 2020-08-28 | Discharge: 2020-08-28 | Disposition: A | Discharge status: Home / Self Care | Payer: Medicare Other | Source: Ambulatory Visit | Attending: Family Medicine | Admitting: Family Medicine

## 2020-08-28 ENCOUNTER — Other Ambulatory Visit: Payer: Self-pay

## 2020-08-28 DIAGNOSIS — Z1231 Encounter for screening mammogram for malignant neoplasm of breast: Secondary | ICD-10-CM

## 2020-09-03 DIAGNOSIS — L570 Actinic keratosis: Secondary | ICD-10-CM | POA: Diagnosis not present

## 2020-09-03 DIAGNOSIS — L918 Other hypertrophic disorders of the skin: Secondary | ICD-10-CM | POA: Diagnosis not present

## 2020-09-03 DIAGNOSIS — L821 Other seborrheic keratosis: Secondary | ICD-10-CM | POA: Diagnosis not present

## 2020-09-03 DIAGNOSIS — D229 Melanocytic nevi, unspecified: Secondary | ICD-10-CM | POA: Diagnosis not present

## 2020-09-03 DIAGNOSIS — L814 Other melanin hyperpigmentation: Secondary | ICD-10-CM | POA: Diagnosis not present

## 2020-09-03 DIAGNOSIS — L819 Disorder of pigmentation, unspecified: Secondary | ICD-10-CM | POA: Diagnosis not present

## 2021-01-01 DIAGNOSIS — M25571 Pain in right ankle and joints of right foot: Secondary | ICD-10-CM | POA: Diagnosis not present

## 2021-01-01 DIAGNOSIS — M19071 Primary osteoarthritis, right ankle and foot: Secondary | ICD-10-CM | POA: Diagnosis not present

## 2021-02-10 DIAGNOSIS — Z23 Encounter for immunization: Secondary | ICD-10-CM | POA: Diagnosis not present

## 2021-04-15 DIAGNOSIS — M21611 Bunion of right foot: Secondary | ICD-10-CM | POA: Diagnosis not present

## 2021-04-15 DIAGNOSIS — M2021 Hallux rigidus, right foot: Secondary | ICD-10-CM | POA: Diagnosis not present

## 2021-04-15 DIAGNOSIS — E785 Hyperlipidemia, unspecified: Secondary | ICD-10-CM | POA: Diagnosis not present

## 2021-04-15 DIAGNOSIS — Z0000 Encounter for general adult medical examination without abnormal findings: Secondary | ICD-10-CM | POA: Diagnosis not present

## 2021-04-15 DIAGNOSIS — E559 Vitamin D deficiency, unspecified: Secondary | ICD-10-CM | POA: Diagnosis not present

## 2021-04-15 DIAGNOSIS — M1990 Unspecified osteoarthritis, unspecified site: Secondary | ICD-10-CM | POA: Diagnosis not present

## 2021-04-15 DIAGNOSIS — M810 Age-related osteoporosis without current pathological fracture: Secondary | ICD-10-CM | POA: Diagnosis not present

## 2021-04-15 DIAGNOSIS — Z23 Encounter for immunization: Secondary | ICD-10-CM | POA: Diagnosis not present

## 2021-04-15 DIAGNOSIS — Z79899 Other long term (current) drug therapy: Secondary | ICD-10-CM | POA: Diagnosis not present

## 2021-04-19 DIAGNOSIS — M21611 Bunion of right foot: Secondary | ICD-10-CM | POA: Insufficient documentation

## 2021-04-25 DIAGNOSIS — M25571 Pain in right ankle and joints of right foot: Secondary | ICD-10-CM | POA: Diagnosis not present

## 2021-04-25 DIAGNOSIS — M21611 Bunion of right foot: Secondary | ICD-10-CM | POA: Diagnosis not present

## 2021-04-25 DIAGNOSIS — M2011 Hallux valgus (acquired), right foot: Secondary | ICD-10-CM | POA: Diagnosis not present

## 2021-04-25 DIAGNOSIS — M85871 Other specified disorders of bone density and structure, right ankle and foot: Secondary | ICD-10-CM | POA: Diagnosis not present

## 2021-04-25 DIAGNOSIS — M19071 Primary osteoarthritis, right ankle and foot: Secondary | ICD-10-CM | POA: Diagnosis not present

## 2021-04-25 DIAGNOSIS — M2021 Hallux rigidus, right foot: Secondary | ICD-10-CM | POA: Diagnosis not present

## 2021-04-25 DIAGNOSIS — I10 Essential (primary) hypertension: Secondary | ICD-10-CM | POA: Diagnosis not present

## 2021-04-25 DIAGNOSIS — M25774 Osteophyte, right foot: Secondary | ICD-10-CM | POA: Diagnosis not present

## 2021-05-06 DIAGNOSIS — Z4789 Encounter for other orthopedic aftercare: Secondary | ICD-10-CM | POA: Diagnosis not present

## 2021-05-14 DIAGNOSIS — Z4789 Encounter for other orthopedic aftercare: Secondary | ICD-10-CM | POA: Diagnosis not present

## 2021-05-21 DIAGNOSIS — Z4789 Encounter for other orthopedic aftercare: Secondary | ICD-10-CM | POA: Diagnosis not present

## 2021-05-28 DIAGNOSIS — Z4789 Encounter for other orthopedic aftercare: Secondary | ICD-10-CM | POA: Diagnosis not present

## 2021-06-19 DIAGNOSIS — M79671 Pain in right foot: Secondary | ICD-10-CM | POA: Diagnosis not present

## 2021-06-19 DIAGNOSIS — T85698A Other mechanical complication of other specified internal prosthetic devices, implants and grafts, initial encounter: Secondary | ICD-10-CM | POA: Diagnosis not present

## 2021-06-19 DIAGNOSIS — S92309A Fracture of unspecified metatarsal bone(s), unspecified foot, initial encounter for closed fracture: Secondary | ICD-10-CM | POA: Diagnosis not present

## 2021-06-19 DIAGNOSIS — M19071 Primary osteoarthritis, right ankle and foot: Secondary | ICD-10-CM | POA: Diagnosis not present

## 2021-06-19 DIAGNOSIS — S92311A Displaced fracture of first metatarsal bone, right foot, initial encounter for closed fracture: Secondary | ICD-10-CM | POA: Diagnosis not present

## 2021-06-25 DIAGNOSIS — Z4789 Encounter for other orthopedic aftercare: Secondary | ICD-10-CM | POA: Diagnosis not present

## 2021-07-04 DIAGNOSIS — Z4789 Encounter for other orthopedic aftercare: Secondary | ICD-10-CM | POA: Diagnosis not present

## 2021-07-18 DIAGNOSIS — Z4789 Encounter for other orthopedic aftercare: Secondary | ICD-10-CM | POA: Diagnosis not present

## 2021-08-05 DIAGNOSIS — Z4789 Encounter for other orthopedic aftercare: Secondary | ICD-10-CM | POA: Diagnosis not present

## 2021-08-26 DIAGNOSIS — Z4789 Encounter for other orthopedic aftercare: Secondary | ICD-10-CM | POA: Diagnosis not present

## 2021-09-19 DIAGNOSIS — M25571 Pain in right ankle and joints of right foot: Secondary | ICD-10-CM | POA: Diagnosis not present

## 2021-09-19 DIAGNOSIS — Z4789 Encounter for other orthopedic aftercare: Secondary | ICD-10-CM | POA: Diagnosis not present

## 2021-09-19 DIAGNOSIS — M19071 Primary osteoarthritis, right ankle and foot: Secondary | ICD-10-CM | POA: Diagnosis not present

## 2021-09-23 ENCOUNTER — Other Ambulatory Visit: Payer: Self-pay | Admitting: Family Medicine

## 2021-09-23 DIAGNOSIS — Z1231 Encounter for screening mammogram for malignant neoplasm of breast: Secondary | ICD-10-CM

## 2021-10-03 ENCOUNTER — Ambulatory Visit
Admission: RE | Admit: 2021-10-03 | Discharge: 2021-10-03 | Disposition: A | Discharge status: Home / Self Care | Payer: Medicare Other | Source: Ambulatory Visit | Attending: Family Medicine | Admitting: Family Medicine

## 2021-10-03 DIAGNOSIS — Z1231 Encounter for screening mammogram for malignant neoplasm of breast: Secondary | ICD-10-CM

## 2021-10-08 DIAGNOSIS — M19071 Primary osteoarthritis, right ankle and foot: Secondary | ICD-10-CM | POA: Diagnosis not present

## 2021-10-08 DIAGNOSIS — M25571 Pain in right ankle and joints of right foot: Secondary | ICD-10-CM | POA: Diagnosis not present

## 2021-10-08 DIAGNOSIS — Z4789 Encounter for other orthopedic aftercare: Secondary | ICD-10-CM | POA: Diagnosis not present

## 2021-10-29 DIAGNOSIS — D225 Melanocytic nevi of trunk: Secondary | ICD-10-CM | POA: Diagnosis not present

## 2021-10-29 DIAGNOSIS — L821 Other seborrheic keratosis: Secondary | ICD-10-CM | POA: Diagnosis not present

## 2021-10-29 DIAGNOSIS — L814 Other melanin hyperpigmentation: Secondary | ICD-10-CM | POA: Diagnosis not present

## 2021-10-29 DIAGNOSIS — L570 Actinic keratosis: Secondary | ICD-10-CM | POA: Diagnosis not present

## 2021-11-05 DIAGNOSIS — M25571 Pain in right ankle and joints of right foot: Secondary | ICD-10-CM | POA: Diagnosis not present

## 2021-11-05 DIAGNOSIS — Z4789 Encounter for other orthopedic aftercare: Secondary | ICD-10-CM | POA: Diagnosis not present

## 2021-11-05 DIAGNOSIS — M19071 Primary osteoarthritis, right ankle and foot: Secondary | ICD-10-CM | POA: Diagnosis not present

## 2021-12-13 IMAGING — MG MM DIGITAL SCREENING BILAT W/ TOMO AND CAD
8 series · 8 of 24 positions shown · non-contrast
Comparison: Previous exam(s).

CLINICAL DATA: Screening.

EXAM:
DIGITAL SCREENING BILATERAL MAMMOGRAM WITH TOMOSYNTHESIS AND CAD
TECHNIQUE: Bilateral screening digital craniocaudal and mediolateral oblique
mammograms were obtained. Bilateral screening digital breast
tomosynthesis was performed. The images were evaluated with
computer-aided detection.

[R CC synth-2D]
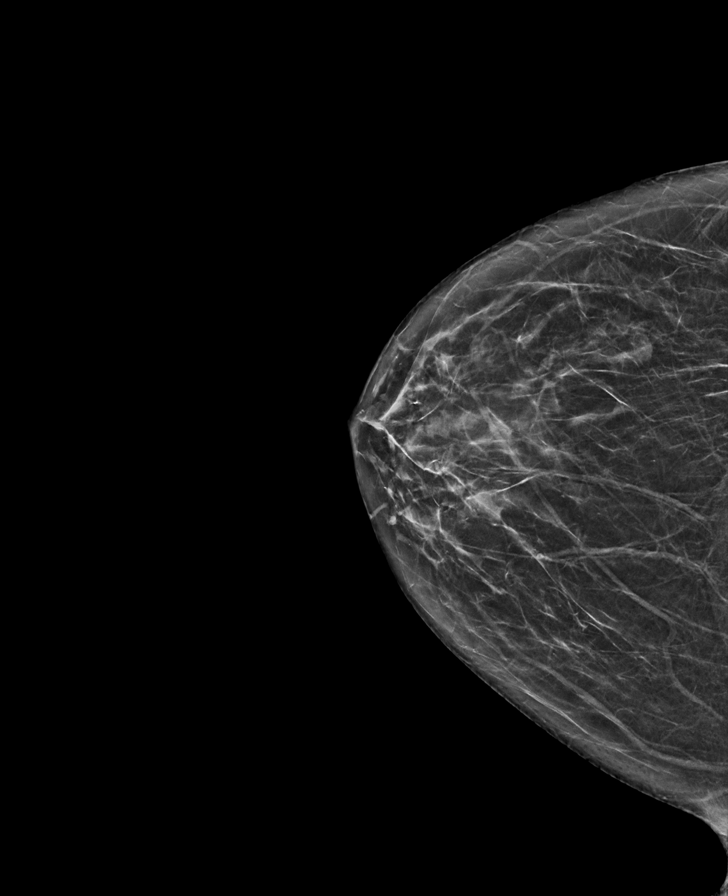

[R MLO synth-2D]
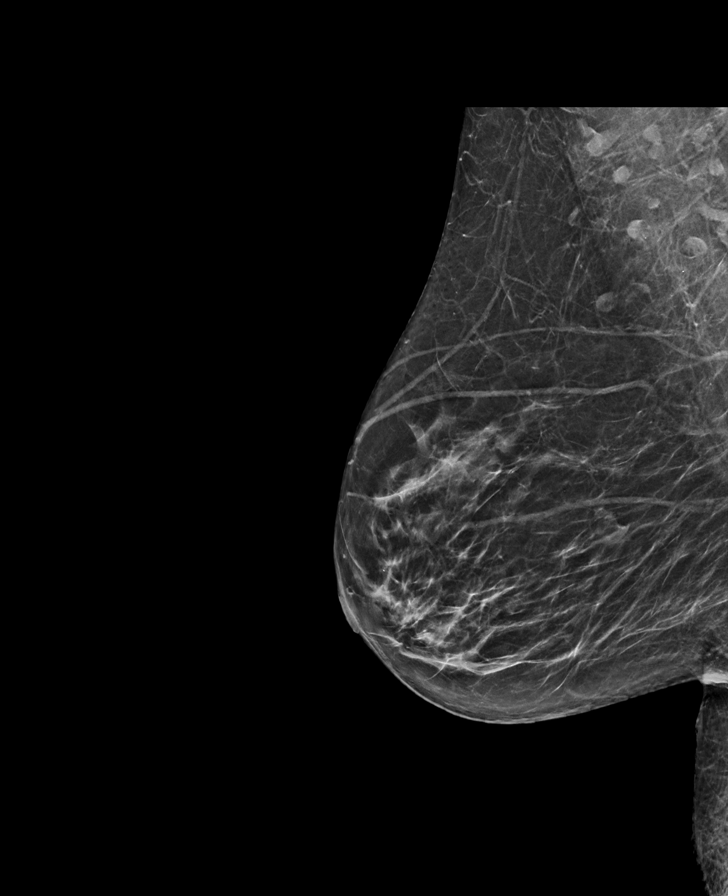

[L CC synth-2D]
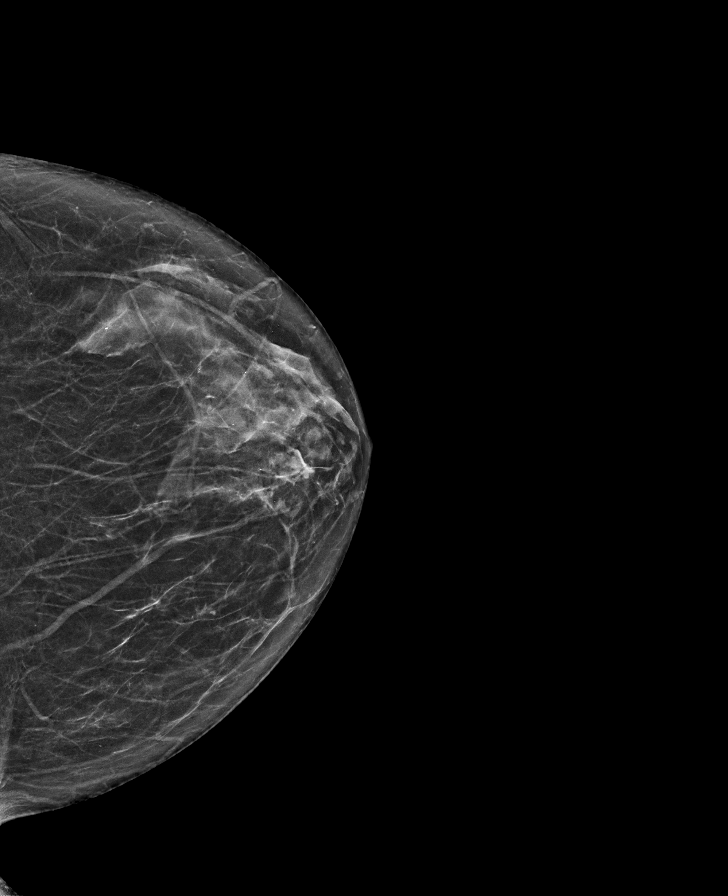

[L MLO synth-2D]
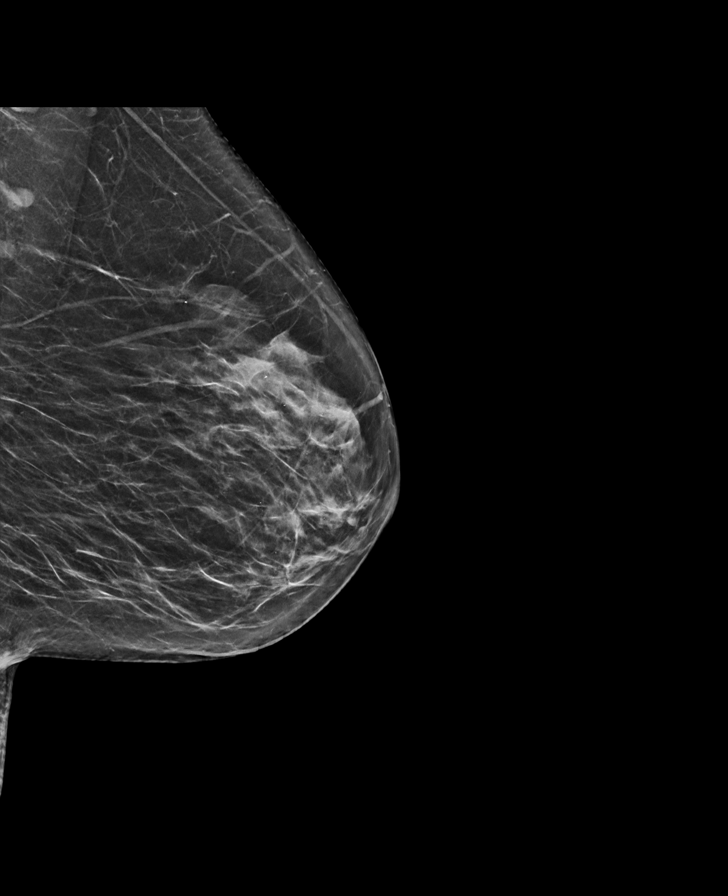

[L CC tomo · tomo slice 29/58.0]
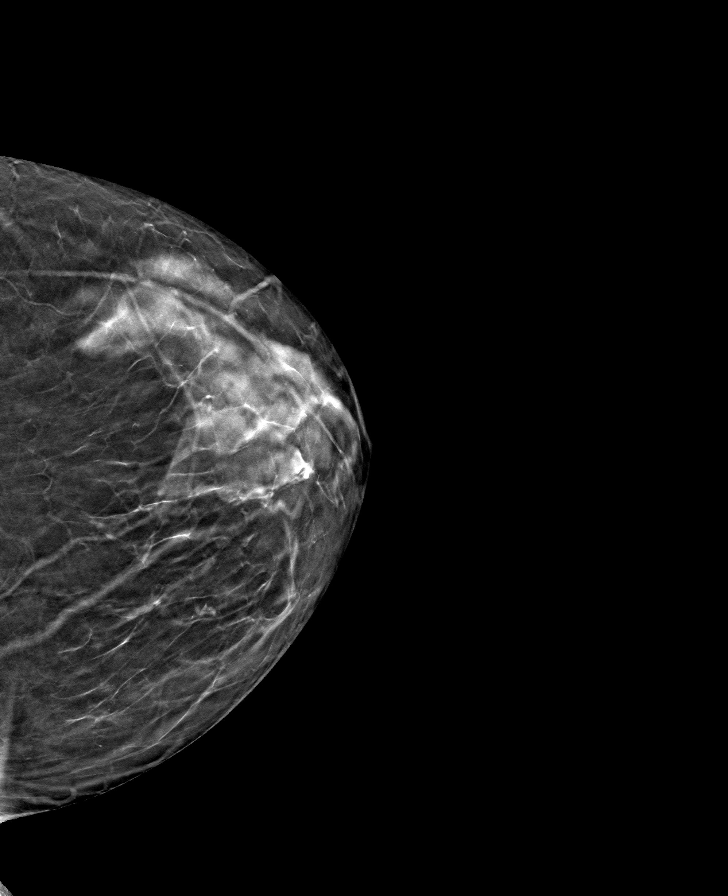

[R MLO tomo · tomo slice 31/61.0]
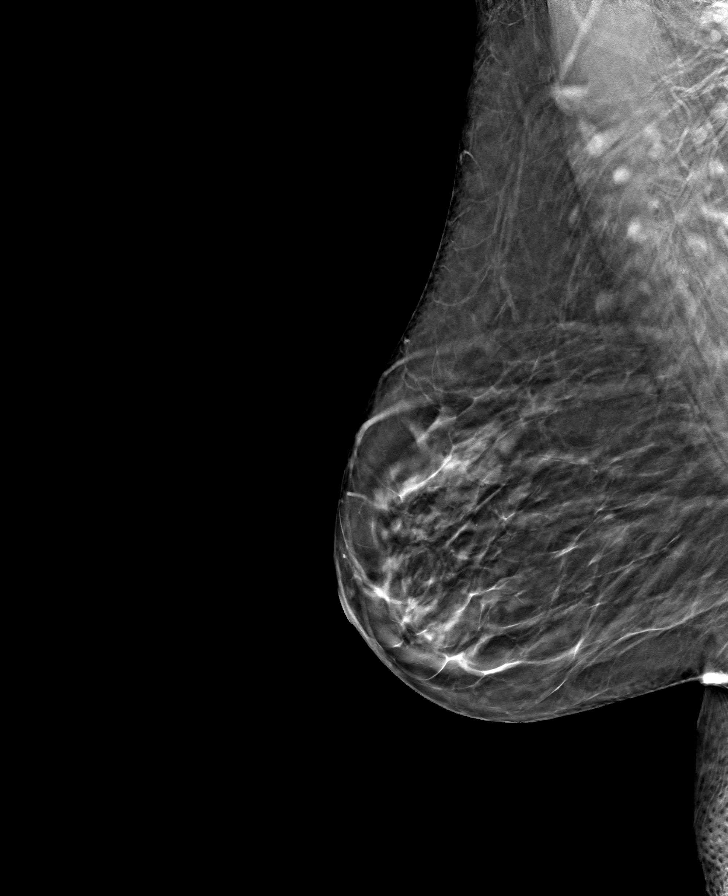

[R CC tomo · tomo slice 30/59.0]
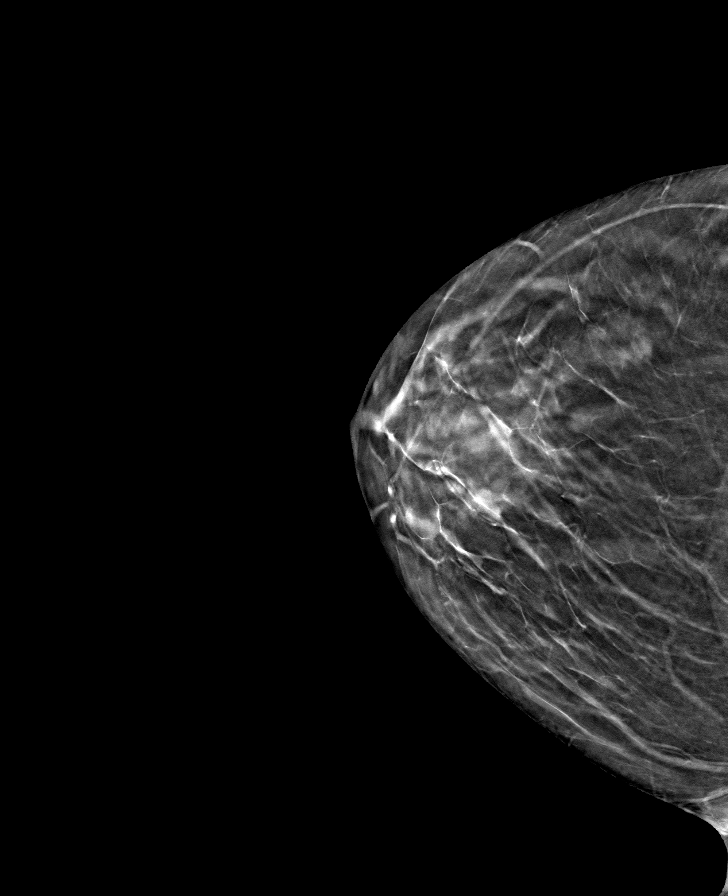

[L MLO tomo · tomo slice 32/63.0]
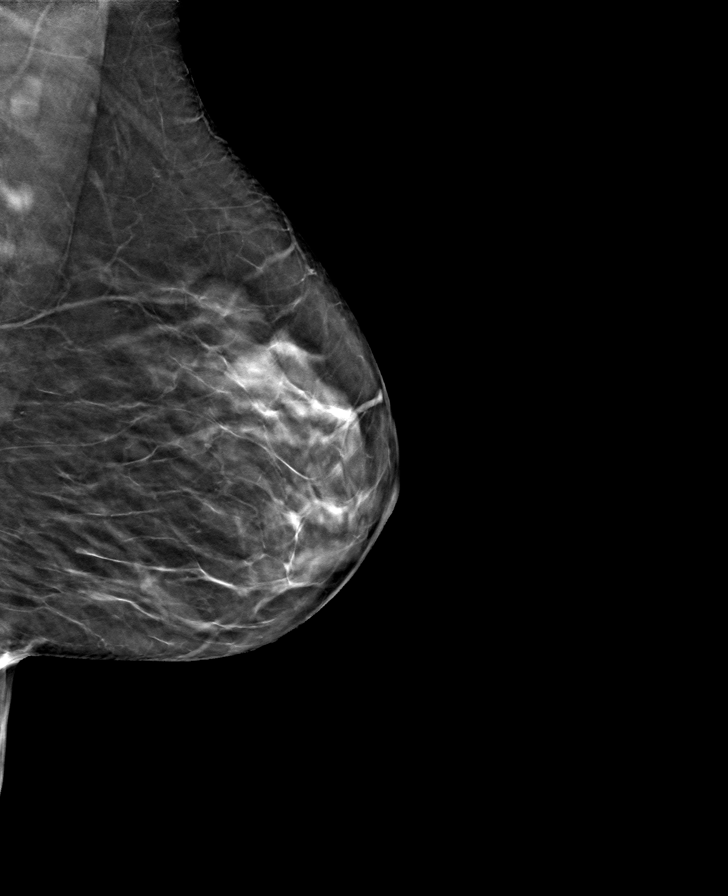

[8 of 24 positions shown; findings below may reference images not displayed]

ACR Breast Density Category b: There are scattered areas of
fibroglandular density.
FINDINGS: There are no findings suspicious for malignancy. The images were
evaluated with computer-aided detection.
IMPRESSION: No mammographic evidence of malignancy. A result letter of this
screening mammogram will be mailed directly to the patient.

RECOMMENDATION:
Screening mammogram in one year. (Code:[id number])

BI-RADS CATEGORY  1: Negative.

## 2022-02-06 DIAGNOSIS — Z23 Encounter for immunization: Secondary | ICD-10-CM | POA: Diagnosis not present

## 2022-02-10 DIAGNOSIS — M2041 Other hammer toe(s) (acquired), right foot: Secondary | ICD-10-CM | POA: Diagnosis not present

## 2022-02-10 DIAGNOSIS — M7731 Calcaneal spur, right foot: Secondary | ICD-10-CM | POA: Diagnosis not present

## 2022-02-10 DIAGNOSIS — M2012 Hallux valgus (acquired), left foot: Secondary | ICD-10-CM | POA: Diagnosis not present

## 2022-02-10 DIAGNOSIS — M205X1 Other deformities of toe(s) (acquired), right foot: Secondary | ICD-10-CM | POA: Diagnosis not present

## 2022-02-10 DIAGNOSIS — Z969 Presence of functional implant, unspecified: Secondary | ICD-10-CM | POA: Diagnosis not present

## 2022-02-10 DIAGNOSIS — M7732 Calcaneal spur, left foot: Secondary | ICD-10-CM | POA: Diagnosis not present

## 2022-02-10 DIAGNOSIS — M2011 Hallux valgus (acquired), right foot: Secondary | ICD-10-CM | POA: Diagnosis not present

## 2022-02-10 DIAGNOSIS — M2042 Other hammer toe(s) (acquired), left foot: Secondary | ICD-10-CM | POA: Diagnosis not present

## 2022-05-12 DIAGNOSIS — H6123 Impacted cerumen, bilateral: Secondary | ICD-10-CM | POA: Diagnosis not present

## 2022-05-12 DIAGNOSIS — H6502 Acute serous otitis media, left ear: Secondary | ICD-10-CM | POA: Diagnosis not present

## 2022-05-12 DIAGNOSIS — Z6829 Body mass index (BMI) 29.0-29.9, adult: Secondary | ICD-10-CM | POA: Diagnosis not present

## 2022-05-22 DIAGNOSIS — H6502 Acute serous otitis media, left ear: Secondary | ICD-10-CM | POA: Diagnosis not present

## 2022-05-22 DIAGNOSIS — Z6829 Body mass index (BMI) 29.0-29.9, adult: Secondary | ICD-10-CM | POA: Diagnosis not present

## 2022-05-29 DIAGNOSIS — J309 Allergic rhinitis, unspecified: Secondary | ICD-10-CM | POA: Diagnosis not present

## 2022-05-29 DIAGNOSIS — I10 Essential (primary) hypertension: Secondary | ICD-10-CM | POA: Diagnosis not present

## 2022-05-29 DIAGNOSIS — R0982 Postnasal drip: Secondary | ICD-10-CM | POA: Diagnosis not present

## 2022-05-29 DIAGNOSIS — E785 Hyperlipidemia, unspecified: Secondary | ICD-10-CM | POA: Diagnosis not present

## 2022-05-29 DIAGNOSIS — Z6829 Body mass index (BMI) 29.0-29.9, adult: Secondary | ICD-10-CM | POA: Diagnosis not present

## 2022-06-03 DIAGNOSIS — E785 Hyperlipidemia, unspecified: Secondary | ICD-10-CM | POA: Diagnosis not present

## 2022-06-03 DIAGNOSIS — T466X5A Adverse effect of antihyperlipidemic and antiarteriosclerotic drugs, initial encounter: Secondary | ICD-10-CM | POA: Diagnosis not present

## 2022-06-03 DIAGNOSIS — E559 Vitamin D deficiency, unspecified: Secondary | ICD-10-CM | POA: Diagnosis not present

## 2022-06-03 DIAGNOSIS — M791 Myalgia, unspecified site: Secondary | ICD-10-CM | POA: Diagnosis not present

## 2022-06-03 DIAGNOSIS — Z79899 Other long term (current) drug therapy: Secondary | ICD-10-CM | POA: Diagnosis not present

## 2022-06-03 DIAGNOSIS — I10 Essential (primary) hypertension: Secondary | ICD-10-CM | POA: Diagnosis not present

## 2022-06-03 DIAGNOSIS — E8881 Metabolic syndrome: Secondary | ICD-10-CM | POA: Diagnosis not present

## 2022-06-03 DIAGNOSIS — Z0000 Encounter for general adult medical examination without abnormal findings: Secondary | ICD-10-CM | POA: Diagnosis not present

## 2022-07-03 ENCOUNTER — Encounter: Payer: Self-pay | Admitting: Gastroenterology

## 2022-07-03 DIAGNOSIS — R1032 Left lower quadrant pain: Secondary | ICD-10-CM | POA: Diagnosis not present

## 2022-07-03 DIAGNOSIS — N200 Calculus of kidney: Secondary | ICD-10-CM | POA: Diagnosis not present

## 2022-07-03 DIAGNOSIS — K5732 Diverticulitis of large intestine without perforation or abscess without bleeding: Secondary | ICD-10-CM | POA: Diagnosis not present

## 2022-07-04 ENCOUNTER — Telehealth: Payer: Self-pay | Admitting: Urology

## 2022-07-04 DIAGNOSIS — I7090 Unspecified atherosclerosis: Secondary | ICD-10-CM | POA: Diagnosis not present

## 2022-07-04 DIAGNOSIS — K5792 Diverticulitis of intestine, part unspecified, without perforation or abscess without bleeding: Secondary | ICD-10-CM | POA: Diagnosis not present

## 2022-07-04 DIAGNOSIS — E7800 Pure hypercholesterolemia, unspecified: Secondary | ICD-10-CM | POA: Insufficient documentation

## 2022-07-04 DIAGNOSIS — R509 Fever, unspecified: Secondary | ICD-10-CM | POA: Diagnosis not present

## 2022-07-04 DIAGNOSIS — Z79899 Other long term (current) drug therapy: Secondary | ICD-10-CM | POA: Diagnosis not present

## 2022-07-04 DIAGNOSIS — Z888 Allergy status to other drugs, medicaments and biological substances status: Secondary | ICD-10-CM | POA: Diagnosis not present

## 2022-07-04 DIAGNOSIS — R109 Unspecified abdominal pain: Secondary | ICD-10-CM | POA: Diagnosis not present

## 2022-07-04 DIAGNOSIS — K5732 Diverticulitis of large intestine without perforation or abscess without bleeding: Secondary | ICD-10-CM | POA: Diagnosis not present

## 2022-07-04 DIAGNOSIS — N200 Calculus of kidney: Secondary | ICD-10-CM | POA: Diagnosis not present

## 2022-07-04 DIAGNOSIS — K429 Umbilical hernia without obstruction or gangrene: Secondary | ICD-10-CM | POA: Diagnosis not present

## 2022-07-04 DIAGNOSIS — I10 Essential (primary) hypertension: Secondary | ICD-10-CM | POA: Diagnosis not present

## 2022-07-04 DIAGNOSIS — R1032 Left lower quadrant pain: Secondary | ICD-10-CM | POA: Diagnosis not present

## 2022-07-04 NOTE — Telephone Encounter (Signed)
Received page from [facility] about critical finding on CT urogram completed yesterday concerning for acute diverticulitis and possible perforation. They recommended she get evaluated in the ED due to these findings.  I called patient and relayed these findings and recommendations to her. She was appreciative of the call. Her symptoms are improving but she notes she will get evaluated today.  Coralyn Pear, MD [facility]rologic Surgery

## 2022-07-08 DIAGNOSIS — Z1330 Encounter for screening examination for mental health and behavioral disorders, unspecified: Secondary | ICD-10-CM | POA: Diagnosis not present

## 2022-07-08 DIAGNOSIS — K5732 Diverticulitis of large intestine without perforation or abscess without bleeding: Secondary | ICD-10-CM | POA: Diagnosis not present

## 2022-07-13 ENCOUNTER — Telehealth: Payer: Self-pay | Admitting: Gastroenterology

## 2022-07-13 ENCOUNTER — Other Ambulatory Visit: Payer: Self-pay

## 2022-07-13 NOTE — Telephone Encounter (Signed)
Inbound call from patient she want tot speak with a nurse in regards a condition she was diagnostic.please advise

## 2022-07-13 NOTE — Telephone Encounter (Signed)
Pt stated that she was recently diagnosed with Diverticulitis and currently being treated. Pt requested  if she could get a colonoscopy: Pt was scheduled to see Dr. Lyndel Safe on 08/19/2022 at 11:20. Pt made aware. Pt verbalized understanding with all questions answered.

## 2022-07-14 ENCOUNTER — Other Ambulatory Visit: Payer: Self-pay | Admitting: Family Medicine

## 2022-07-14 DIAGNOSIS — Z0000 Encounter for general adult medical examination without abnormal findings: Secondary | ICD-10-CM

## 2022-08-14 DIAGNOSIS — R591 Generalized enlarged lymph nodes: Secondary | ICD-10-CM | POA: Diagnosis not present

## 2022-08-14 DIAGNOSIS — K5732 Diverticulitis of large intestine without perforation or abscess without bleeding: Secondary | ICD-10-CM | POA: Diagnosis not present

## 2022-08-14 DIAGNOSIS — E663 Overweight: Secondary | ICD-10-CM | POA: Diagnosis not present

## 2022-08-14 DIAGNOSIS — Z6829 Body mass index (BMI) 29.0-29.9, adult: Secondary | ICD-10-CM | POA: Diagnosis not present

## 2022-08-19 ENCOUNTER — Encounter: Payer: Self-pay | Admitting: Gastroenterology

## 2022-08-19 ENCOUNTER — Ambulatory Visit (INDEPENDENT_AMBULATORY_CARE_PROVIDER_SITE_OTHER): Payer: Medicare Other | Admitting: Gastroenterology

## 2022-08-19 VITALS — BP 110/80 | HR 92 | Ht 63.5 in | Wt 181.5 lb

## 2022-08-19 DIAGNOSIS — K5732 Diverticulitis of large intestine without perforation or abscess without bleeding: Secondary | ICD-10-CM

## 2022-08-19 DIAGNOSIS — Z86010 Personal history of colonic polyps: Secondary | ICD-10-CM | POA: Diagnosis not present

## 2022-08-19 NOTE — Progress Notes (Signed)
Chief Complaint: For colonoscopy  Referring Provider:  Street, Stephanie Coup, *      ASSESSMENT AND PLAN;   #1. Recent sigmoid diverticulitis 07/08/2022  #2. H/O polyps 09/2014  Plan: -High-fiber diet/Metamucil to continue -Colon with miralax in 6-8 weeks -D/W pt and daughter   Discussed risks & benefits of colonoscopy. Risks including rare perforation req laparotomy, bleeding after bx/polypectomy req blood transfusion, rarely missing neoplasms, risks of anesthesia/sedation, rare risk of damage to internal organs. Benefits outweigh the risks. Patient agrees to proceed. All the questions were answered. Pt consents to proceed. HPI:    Mary Barnett is a 72 y.o. female  With H/O HTN, HLD, kidney stones, history of UTIs, diverticulosis, colon polyps 2016, hemorrhoidectomy 1983, bilateral knee replacements  [facility] ED visit for LLQ pain.  She initially thought it was a kidney stone. Underwent CT Abdo/pelvis 07/08/2022 showing focal sigmoid diverticulitis with microperforation. Treated with Augmentin x 10 days with good relief.  Has seen Dr. Durene Cal (from surgery as well)  Here for follow-up visit. Doing much better on Metamucil She drinks plenty of water No further abdominal pain.  No diarrhea or constipation. Denies having any fever or chills. She denies having any upper GI symptoms including nausea or vomiting.  Here for colonoscopy.   Previous GI workup: Colonoscopy 09/2014 ([facility]) - 1 cm Colonic polyp s/p polypectomy. Bx- TA. Rpt 3 yrs. did get letters -Mild sigmoid diverticulosis -Family history of colon polyps  CT AP with contrast 07/08/2022 IMPRESSION: 1. Focal diverticulitis in the sigmoid colon. Small focus of gas along the wall of the colon may represent an inflamed diverticulum or small contained perforation. No evidence of drainable fluid collection. Underlying lesion is not excluded and correlation with colonoscopy history is recommended. 2. Left nephrolithiasis  without hydronephrosis    Colon  Past Medical History:  Diagnosis Date   Adenomatous colon polyp    Arthritis    Diverticulitis    Diverticulosis    History of kidney stones    Hypercholesterolemia    Hypertension    PONV (postoperative nausea and vomiting)    Pre-diabetes     Past Surgical History:  Procedure Laterality Date   ABDOMINAL HYSTERECTOMY     BUNIONECTOMY Right    carpel tunnel surgery Right    rt hand   COLONOSCOPY W/ POLYPECTOMY     EXTRACORPOREAL SHOCK WAVE LITHOTRIPSY Left 12/10/2016   Procedure: LEFT EXTRACORPOREAL SHOCK WAVE LITHOTRIPSY (ESWL);  Surgeon: Bjorn Pippin, MD;  Location: [facility];  Service: Urology;  Laterality: Left;   EXTRACORPOREAL SHOCK WAVE LITHOTRIPSY Left 07/21/2018   Procedure: EXTRACORPOREAL SHOCK WAVE LITHOTRIPSY (ESWL);  Surgeon: Rene Paci, MD;  Location: [facility];  Service: Urology;  Laterality: Left;   EXTRACORPOREAL SHOCK WAVE LITHOTRIPSY Right 01/18/2020   Procedure: RIGHT EXTRACORPOREAL SHOCK WAVE LITHOTRIPSY (ESWL);  Surgeon: Belva Agee, MD;  Location[facility];  Service: Urology;  Laterality: Right;   EXTRACORPOREAL SHOCK WAVE LITHOTRIPSY Right 04/11/2020   Procedure: RIGHT EXTRACORPOREAL SHOCK WAVE LITHOTRIPSY (ESWL);  Surgeon: Bjorn Pippin, MD;  Location: [facility];  Service: Urology;  Laterality: Right;   FEMUR IM NAIL Left 07/14/2013   Procedure: INTRAMEDULLARY (IM) NAIL FEMORAL;  Surgeon: Harvie Junior, MD;  Location: [facility];  Service: Orthopedics;  Laterality: Left;   HEEL SPUR SURGERY Right    bone spur rt heel   KNEE ARTHROPLASTY Left 12/07/2014   Procedure: COMPUTER ASSISTED TOTAL KNEE ARTHROPLASTY, removal of hardware from distal femur;  Surgeon: Jodi Geralds, MD;  Location: [facility];  Service: Orthopedics;  Laterality: Left;   KNEE ARTHROSCOPY Bilateral    LITHOTRIPSY     TOTAL KNEE ARTHROPLASTY Right 03/04/2018   Procedure: TOTAL KNEE ARTHROPLASTY;  Surgeon: Jodi Geralds, MD;  Location: [facility];  Service: Orthopedics;  Laterality: Right;  Adductor Block   TUMOR REMOVAL     Removed from tailbone    Family History  Problem Relation Age of Onset   Hyperlipidemia Mother    Hypertension Mother    Lung cancer Father    Colon polyps Sister    Colon polyps Sister    Diabetes Brother    Diabetes Maternal Grandmother    Breast cancer Cousin    Kidney Stones Daughter     Social History   Tobacco Use   Smoking status: Never   Smokeless tobacco: Never  Vaping Use   Vaping Use: Never used  Substance Use Topics   Alcohol use: No   Drug use: No    Current Outpatient Medications  Medication Sig Dispense Refill   Cholecalciferol (VITAMIN D3) 125 MCG (5000 UT) CAPS Take 1 capsule by mouth daily.     losartan (COZAAR) 25 MG tablet Take 25 mg by mouth daily.     simvastatin (ZOCOR) 20 MG tablet Take 20 mg by mouth once a week.     No current facility-administered medications for this visit.    Allergies  Allergen Reactions   Cortisone Itching and Other (See Comments)    flushing of face    Review of Systems:  Constitutional: Denies fever, chills, diaphoresis, appetite change and fatigue.  HEENT: Denies photophobia, eye pain, redness, hearing loss, ear pain, congestion, sore throat, rhinorrhea, sneezing, mouth sores, neck pain, neck stiffness and tinnitus.   Respiratory: Denies SOB, DOE, cough, chest tightness,  and wheezing.   Cardiovascular: Denies chest pain, palpitations and leg swelling.  Genitourinary: Denies dysuria, urgency, frequency, hematuria, flank pain and difficulty urinating.  Musculoskeletal: Has arthritis Neurological: Denies dizziness, seizures, syncope, weakness, light-headedness, numbness and headaches.  Hematological: Denies adenopathy. Easy bruising, personal or family bleeding history  Psychiatric/Behavioral: No anxiety or depression     Physical Exam:    BP 110/80 (BP Location: Left Arm, Patient Position: Sitting,  Cuff Size: Normal)   Pulse 92   Ht 5' 3.5" (1.613 m) Comment: height measured without shoes  Wt 181 lb 8 oz (82.3 kg)   BMI 31.65 kg/m  Wt Readings from Last 3 Encounters:  08/19/22 181 lb 8 oz (82.3 kg)  04/11/20 147 lb (66.7 kg)  01/18/20 141 lb (64 kg)   Constitutional:  Well-developed, in no acute distress. Psychiatric: Normal mood and affect. Behavior is normal. HEENT: Pupils normal.  Conjunctivae are normal. No scleral icterus. Cardiovascular: Normal rate, regular rhythm. No edema Pulmonary/chest: Effort normal and breath sounds normal. No wheezing, rales or rhonchi. Abdominal: Soft, nondistended. Nontender. Bowel sounds active throughout. There are no masses palpable. No hepatomegaly.  Small umbilical hernia and small right lower quadrant hernia without tenderness. Rectal: Deferred Neurological: Alert and oriented to person place and time. Skin: Skin is warm and dry. No rashes noted.  Data Reviewed: I have personally reviewed following labs and imaging studies   CBC  [facility]03/12/2022 Component 07/04/2022     WBC 6.0  RBC 4.61  HGB 13.5  HCT 41.6  MCV 90  MCH 29.3  MCHC 32.5  Plt Ct 248  RDW SD 41.9  MPV 10.8   CHEM PROFILE  [facility]03/12/2022 Component 07/04/2022     Na 139  Potassium 4.4  Cl 103  CO2 25  AGAP 11  Glucose 108 High     BUN 14  Creatinine 0.60  Ca 9.8  ALK PHOS 76  T Bili 0.31  Total Protein 7.7  Alb 4.6  GLOBULIN 3.1  ALBUMIN/GLOBULIN RATIO 1.5  BUN/CREAT RATIO 23.3  ALT 21  AST 23  eGFR 97     Edman Circle, MD 08/19/2022, 11:21 AM  Cc: Street, Stephanie Coup, *

## 2022-08-19 NOTE — Patient Instructions (Addendum)
_______________________________________________________  If your blood pressure at your visit was 140/90 or greater, please contact your primary care physician to follow up on this.  _______________________________________________________  If you are age 70 or older, your body mass index should be between 23-30. Your Body mass index is 31.65 kg/m. If this is out of the aforementioned range listed, please consider follow up with your Primary Care Provider.  If you are age 70 or younger, your body mass index should be between 19-25. Your Body mass index is 31.65 kg/m. If this is out of the aformentioned range listed, please consider follow up with your Primary Care Provider.   ________________________________________________________  The [place] [facility] providers would like to encourage you to use [facility] to communicate with providers for non-urgent requests or questions.  Due to long hold times on the telephone, sending your provider a message by [facility] may be a faster and more efficient way to get a response.  Please allow 48 business hours for a response.  Please remember that this is for non-urgent requests.  _______________________________________________________  Bonita Quin have been scheduled for a colonoscopy. Please follow written instructions given to you at your visit today.  Please pick up your prep supplies at the pharmacy within the next 1-3 days. If you use inhalers (even only as needed), please bring them with you on the day of your procedure. Scheduled 10-21-2022 at 830am arrival time for 930am   Previsit nurse appointment set for 09-30-2022 at 8am. She will Call [phone]. Prep will be miralax  Do high fiber diet   High-Fiber Eating Plan Fiber, also called dietary fiber, is a type of carbohydrate. It is found foods such as fruits, vegetables, whole grains, and beans. A high-fiber diet can have many health benefits. Your health care provider may recommend a high-fiber diet to  help: Prevent constipation. Fiber can make your bowel movements more regular. Lower your cholesterol. Relieve the following conditions: Inflammation of veins in the anus (hemorrhoids). Inflammation of specific areas of the digestive tract (uncomplicated diverticulosis). A problem of the large intestine, also called the colon, that sometimes causes pain and diarrhea (irritable bowel syndrome, or IBS). Prevent overeating as part of a weight-loss plan. Prevent heart disease, type 2 diabetes, and certain cancers. What are tips for following this plan? Reading food labels  Check the nutrition facts label on food products for the amount of dietary fiber. Choose foods that have 5 grams of fiber or more per serving. The goals for recommended daily fiber intake include: Men (age 34 or younger): 34-38 g. Men (over age 69): 28-34 g. Women (age 36 or younger): 25-28 g. Women (over age 88): 22-25 g. Your daily fiber goal is _____________ g. Shopping Choose whole fruits and vegetables instead of processed forms, such as apple juice or applesauce. Choose a wide variety of high-fiber foods such as avocados, lentils, oats, and kidney beans. Read the nutrition facts label of the foods you choose. Be aware of foods with added fiber. These foods often have high sugar and sodium amounts per serving. Cooking Use whole-grain flour for baking and cooking. Cook with brown rice instead of white rice. Meal planning Start the day with a breakfast that is high in fiber, such as a cereal that contains 5 g of fiber or more per serving. Eat breads and cereals that are made with whole-grain flour instead of refined flour or white flour. Eat brown rice, bulgur wheat, or millet instead of white rice. Use beans in place of meat in soups,  salads, and pasta dishes. Be sure that half of the grains you eat each day are whole grains. General information You can get the recommended daily intake of dietary fiber by: Eating a  variety of fruits, vegetables, grains, nuts, and beans. Taking a fiber supplement if you are not able to take in enough fiber in your diet. It is better to get fiber through food than from a supplement. Gradually increase how much fiber you consume. If you increase your intake of dietary fiber too quickly, you may have bloating, cramping, or gas. Drink plenty of water to help you digest fiber. Choose high-fiber snacks, such as berries, raw vegetables, nuts, and popcorn. What foods should I eat? Fruits Berries. Pears. Apples. Oranges. Avocado. Prunes and raisins. Dried figs. Vegetables Sweet potatoes. Spinach. Kale. Artichokes. Cabbage. Broccoli. Cauliflower. Green peas. Carrots. Squash. Grains Whole-grain breads. Multigrain cereal. Oats and oatmeal. Brown rice. Barley. Bulgur wheat. Millet. Quinoa. Bran muffins. Popcorn. Rye wafer crackers. Meats and other proteins Navy beans, kidney beans, and pinto beans. Soybeans. Split peas. Lentils. Nuts and seeds. Dairy Fiber-fortified yogurt. Beverages Fiber-fortified soy milk. Fiber-fortified orange juice. Other foods Fiber bars. The items listed above may not be a complete list of recommended foods and beverages. Contact a dietitian for more information. What foods should I avoid? Fruits Fruit juice. Cooked, strained fruit. Vegetables Fried potatoes. Canned vegetables. Well-cooked vegetables. Grains White bread. Pasta made with refined flour. White rice. Meats and other proteins Fatty cuts of meat. Fried chicken or fried fish. Dairy Milk. Yogurt. Cream cheese. Sour cream. Fats and oils Butters. Beverages Soft drinks. Other foods Cakes and pastries. The items listed above may not be a complete list of foods and beverages to avoid. Talk with your dietitian about what choices are best for you. Summary Fiber is a type of carbohydrate. It is found in foods such as fruits, vegetables, whole grains, and beans. A high-fiber diet has many  benefits. It can help to prevent constipation, lower blood cholesterol, aid weight loss, and reduce your risk of heart disease, diabetes, and certain cancers. Increase your intake of fiber gradually. Increasing fiber too quickly may cause cramping, bloating, and gas. Drink plenty of water while you increase the amount of fiber you consume. The best sources of fiber include whole fruits and vegetables, whole grains, nuts, seeds, and beans. This information is not intended to replace advice given to you by your health care provider. Make sure you discuss any questions you have with your health care provider. Document Revised: 08/17/2019 Document Reviewed: 08/17/2019 Elsevier Patient Education  2023 Elsevier Inc.  Thank you,  Dr. Lynann Bologna

## 2022-08-20 DIAGNOSIS — E041 Nontoxic single thyroid nodule: Secondary | ICD-10-CM | POA: Diagnosis not present

## 2022-08-20 DIAGNOSIS — R591 Generalized enlarged lymph nodes: Secondary | ICD-10-CM | POA: Diagnosis not present

## 2022-08-27 DIAGNOSIS — E041 Nontoxic single thyroid nodule: Secondary | ICD-10-CM | POA: Diagnosis not present

## 2022-09-29 ENCOUNTER — Ambulatory Visit: Payer: Medicare Other | Admitting: Gastroenterology

## 2022-09-30 ENCOUNTER — Ambulatory Visit (AMBULATORY_SURGERY_CENTER): Payer: Medicare Other

## 2022-09-30 VITALS — Ht 63.5 in | Wt 181.0 lb

## 2022-09-30 DIAGNOSIS — Z860100 Personal history of colon polyps, unspecified: Secondary | ICD-10-CM

## 2022-09-30 DIAGNOSIS — K5732 Diverticulitis of large intestine without perforation or abscess without bleeding: Secondary | ICD-10-CM

## 2022-09-30 NOTE — Progress Notes (Signed)
No egg or soy allergy known to patient  No issues known to pt with past sedation with any surgeries or procedures: PONV  Patient denies ever being told they had issues or difficulty with intubation  No FH of Malignant Hyperthermia Pt is not on diet pills Pt is not on  home 02  Pt is not on blood thinners  Pt denies issues with constipation  No A fib or A flutter Have any cardiac testing pending--no  Pt is ambulatory   Patient's chart reviewed by Cathlyn Parsons CNRA prior to previsit and patient appropriate for the [facility].  Previsit completed and red dot placed by patient's name on their procedure day (on provider's schedule).     PV completed. Prep instructions reviewed and sent via mychart and to home address. Pt to purchase all prep products OTC.

## 2022-10-05 ENCOUNTER — Ambulatory Visit
Admission: RE | Admit: 2022-10-05 | Discharge: 2022-10-05 | Disposition: A | Discharge status: Home / Self Care | Payer: Medicare Other | Source: Ambulatory Visit | Attending: Family Medicine | Admitting: Family Medicine

## 2022-10-05 DIAGNOSIS — Z0000 Encounter for general adult medical examination without abnormal findings: Secondary | ICD-10-CM

## 2022-10-05 DIAGNOSIS — Z1231 Encounter for screening mammogram for malignant neoplasm of breast: Secondary | ICD-10-CM | POA: Diagnosis not present

## 2022-10-21 ENCOUNTER — Encounter: Payer: Self-pay | Admitting: Gastroenterology

## 2022-10-21 ENCOUNTER — Ambulatory Visit (AMBULATORY_SURGERY_CENTER): Payer: Medicare Other | Admitting: Gastroenterology

## 2022-10-21 VITALS — BP 108/69 | HR 79 | Temp 98.4°F | Resp 14 | Ht 63.5 in | Wt 181.0 lb

## 2022-10-21 DIAGNOSIS — D123 Benign neoplasm of transverse colon: Secondary | ICD-10-CM | POA: Diagnosis not present

## 2022-10-21 DIAGNOSIS — D125 Benign neoplasm of sigmoid colon: Secondary | ICD-10-CM | POA: Diagnosis not present

## 2022-10-21 DIAGNOSIS — Z09 Encounter for follow-up examination after completed treatment for conditions other than malignant neoplasm: Secondary | ICD-10-CM | POA: Diagnosis not present

## 2022-10-21 DIAGNOSIS — E7800 Pure hypercholesterolemia, unspecified: Secondary | ICD-10-CM | POA: Diagnosis not present

## 2022-10-21 DIAGNOSIS — D122 Benign neoplasm of ascending colon: Secondary | ICD-10-CM

## 2022-10-21 DIAGNOSIS — I10 Essential (primary) hypertension: Secondary | ICD-10-CM | POA: Diagnosis not present

## 2022-10-21 DIAGNOSIS — Z86010 Personal history of colonic polyps: Secondary | ICD-10-CM

## 2022-10-21 DIAGNOSIS — R7303 Prediabetes: Secondary | ICD-10-CM | POA: Diagnosis not present

## 2022-10-21 MED ORDER — SODIUM CHLORIDE 0.9 % IV SOLN: 500.0000 mL | Freq: Once | INTRAVENOUS | Status: DC

## 2022-10-21 NOTE — Progress Notes (Signed)
Uneventful anesthetic. Report to pacu rn. Vss. Care resumed by rn. 

## 2022-10-21 NOTE — Op Note (Signed)
[facility] Patient Name: Mary Barnett Procedure Date: 10/21/2022 8:56 AM MRN: [id number] Endoscopist: Lynann Bologna , MD, [id number] Age: 72 Referring MD:  Date of Birth: 23-Dec-1950 Gender: Female Account #: [id number] Procedure:                Colonoscopy Indications:              High risk colon cancer surveillance: Personal                            history of colonic polyps 2016. Recent sigmoid                            diverticulitis. Medicines:                Monitored Anesthesia Care Procedure:                Pre-Anesthesia Assessment:                           - Prior to the procedure, a History and Physical                            was performed, and patient medications and                            allergies were reviewed. The patient's tolerance of                            previous anesthesia was also reviewed. The risks                            and benefits of the procedure and the sedation                            options and risks were discussed with the patient.                            All questions were answered, and informed consent                            was obtained. Prior Anticoagulants: The patient has                            taken no anticoagulant or antiplatelet agents. ASA                            Grade Assessment: II - A patient with mild systemic                            disease. After reviewing the risks and benefits,                            the patient was deemed in satisfactory condition to  undergo the procedure.                           After obtaining informed consent, the colonoscope                            was passed under direct vision. Throughout the                            procedure, the patient's blood pressure, pulse, and                            oxygen saturations were monitored continuously. The                            Olympus PCF-H190DL ([id number]) Colonoscope was                             introduced through the anus and advanced to the 2                            cm into the ileum. The colonoscopy was performed                            without difficulty. The patient tolerated the                            procedure well. The quality of the bowel                            preparation was good. The terminal ileum, ileocecal                            valve, appendiceal orifice, and rectum were                            photographed. Scope In: 9:15:07 AM Scope Out: 9:39:42 AM Scope Withdrawal Time: 0 hours 15 minutes 53 seconds  Total Procedure Duration: 0 hours 24 minutes 35 seconds  Findings:                 Eight sessile polyps were found in the proximal                            transverse colon, mid transverse colon and                            ascending colon. The polyps were 6 to 10 mm in                            size. These polyps were removed with a cold snare.                            Resection and retrieval were complete.  A 6 mm polyp was found in the proximal sigmoid                            colon. The polyp was sessile. The polyp was removed                            with a cold snare. Resection and retrieval were                            complete.                           Multiple medium-mouthed diverticula were found in                            the sigmoid colon, few in descending colon and                            ascending colon. Few diverticula in the sigmoid                            colon with stool impacted. Also sigmoid colon had a                            "Swiss cheese appearance" with associated luminal                            narrowing.                           Non-bleeding internal hemorrhoids were found during                            retroflexion. The hemorrhoids were small and Grade                            I (internal hemorrhoids that do not prolapse).                            The terminal ileum appeared normal.                           The exam was otherwise without abnormality on                            direct and retroflexion views. Complications:            No immediate complications. Estimated Blood Loss:     Estimated blood loss: none. Impression:               - Eight 6 to 10 mm polyps in the proximal                            transverse colon, in the mid transverse colon and  in the ascending colon, removed with a cold snare.                            Resected and retrieved.                           - One 6 mm polyp in the proximal sigmoid colon,                            removed with a cold snare. Resected and retrieved.                           - Pancolonic diverticulosis predominantly in the                            sigmoid colon.                           - Non-bleeding internal hemorrhoids.                           - The examined portion of the ileum was normal.                           - The examination was otherwise normal on direct                            and retroflexion views. Recommendation:           - Patient has a contact number available for                            emergencies. The signs and symptoms of potential                            delayed complications were discussed with the                            patient. Return to normal activities tomorrow.                            Written discharge instructions were provided to the                            patient.                           - High fiber diet.                           - Continue present medications including Metamucil.                           - Await pathology results.                           -  No aspirin, ibuprofen, naproxen, or other                            non-steroidal anti-inflammatory drugs for 5 days                            after polyp removal.                           - The findings and  recommendations were discussed                            with the patient's family. Lynann Bologna, MD 10/21/2022 9:47:39 AM This report has been signed electronically.

## 2022-10-21 NOTE — Patient Instructions (Addendum)
Await pathology results.  High fiber diet.  Continue present medications including Metamucil.  No aspirin, ibuprofen/advil, naproxen or other non-steroidal anti-inflammatory drugs for 5 days after polyp removal.  Handouts on polyps, diverticulosis, and hemorrhoids provided.   YOU HAD AN ENDOSCOPIC PROCEDURE TODAY AT THE [facility]:   Refer to the procedure report that was given to you for any specific questions about what was found during the examination.  If the procedure report does not answer your questions, please call your gastroenterologist to clarify.  If you requested that your care partner not be given the details of your procedure findings, then the procedure report has been included in a sealed envelope for you to review at your convenience later.  YOU SHOULD EXPECT: Some feelings of bloating in the abdomen. Passage of more gas than usual.  Walking can help get rid of the air that was put into your GI tract during the procedure and reduce the bloating. If you had a lower endoscopy (such as a colonoscopy or flexible sigmoidoscopy) you may notice spotting of blood in your stool or on the toilet paper. If you underwent a bowel prep for your procedure, you may not have a normal bowel movement for a few days.  Please Note:  You might notice some irritation and congestion in your nose or some drainage.  This is from the oxygen used during your procedure.  There is no need for concern and it should clear up in a day or so.  SYMPTOMS TO REPORT IMMEDIATELY:  Following lower endoscopy (colonoscopy or flexible sigmoidoscopy):  Excessive amounts of blood in the stool  Significant tenderness or worsening of abdominal pains  Swelling of the abdomen that is new, acute  Fever of 100F or higher   For urgent or emergent issues, a gastroenterologist can be reached at any hour by calling [phone]. Do not use MyChart messaging for urgent concerns.    DIET:  We do recommend a  small meal at first, but then you may proceed to your regular diet.  Drink plenty of fluids but you should avoid alcoholic beverages for 24 hours.  ACTIVITY:  You should plan to take it easy for the rest of today and you should NOT DRIVE or use heavy machinery until tomorrow (because of the sedation medicines used during the test).    FOLLOW UP: Our staff will call the number listed on your records the next business day following your procedure.  We will call around 7:15- 8:00 am to check on you and address any questions or concerns that you may have regarding the information given to you following your procedure. If we do not reach you, we will leave a message.     If any biopsies were taken you will be contacted by phone or by letter within the next 1-3 weeks.  Please call us at [phone] if you have not heard about the biopsies in 3 weeks.    SIGNATURES/CONFIDENTIALITY: You and/or your care partner have signed paperwork which will be entered into your electronic medical record.  These signatures attest to the fact that that the information above on your After Visit Summary has been reviewed and is understood.  Full responsibility of the confidentiality of this discharge information lies with you and/or your care-partner.

## 2022-10-21 NOTE — Progress Notes (Signed)
Vitals-Mary Barnett  Pt's states no medical or surgical changes since previsit or office visit. 

## 2022-10-21 NOTE — Progress Notes (Signed)
Chief Complaint: For colonoscopy  Referring Provider:  Street, Stephanie Coup, *      ASSESSMENT AND PLAN;   #1. Recent sigmoid diverticulitis 07/08/2022  #2. H/O polyps 09/2014  Plan: -High-fiber diet/Metamucil to continue -Colon with miralax in 6-8 weeks -D/W pt and daughter   Discussed risks & benefits of colonoscopy. Risks including rare perforation req laparotomy, bleeding after bx/polypectomy req blood transfusion, rarely missing neoplasms, risks of anesthesia/sedation, rare risk of damage to internal organs. Benefits outweigh the risks. Patient agrees to proceed. All the questions were answered. Pt consents to proceed. HPI:    Mary Barnett is a 72 y.o. female  With H/O HTN, HLD, kidney stones, history of UTIs, diverticulosis, colon polyps 2016, hemorrhoidectomy 1983, bilateral knee replacements  [facility] ED visit for LLQ pain.  She initially thought it was a kidney stone. Underwent CT Abdo/pelvis 07/08/2022 showing focal sigmoid diverticulitis with microperforation. Treated with Augmentin x 10 days with good relief.  Has seen Dr. Durene Cal (from surgery as well)  Here for follow-up visit. Doing much better on Metamucil She drinks plenty of water No further abdominal pain.  No diarrhea or constipation. Denies having any fever or chills. She denies having any upper GI symptoms including nausea or vomiting.  Here for colonoscopy.   Previous GI workup: Colonoscopy 09/2014 ([facility]) - 1 cm Colonic polyp s/p polypectomy. Bx- TA. Rpt 3 yrs. did get letters -Mild sigmoid diverticulosis -Family history of colon polyps  CT AP with contrast 07/08/2022 IMPRESSION: 1. Focal diverticulitis in the sigmoid colon. Small focus of gas along the wall of the colon may represent an inflamed diverticulum or small contained perforation. No evidence of drainable fluid collection. Underlying lesion is not excluded and correlation with colonoscopy history is recommended. 2. Left nephrolithiasis  without hydronephrosis    Colon  Past Medical History:  Diagnosis Date   Adenomatous colon polyp    Arthritis    Diverticulitis    Diverticulosis    History of kidney stones    Hypercholesterolemia    Hypertension    PONV (postoperative nausea and vomiting)    Pre-diabetes     Past Surgical History:  Procedure Laterality Date   ABDOMINAL HYSTERECTOMY     BUNIONECTOMY Right    carpel tunnel surgery Right    rt hand   COLONOSCOPY W/ POLYPECTOMY     EXTRACORPOREAL SHOCK WAVE LITHOTRIPSY Left 12/10/2016   Procedure: LEFT EXTRACORPOREAL SHOCK WAVE LITHOTRIPSY (ESWL);  Surgeon: Bjorn Pippin, MD;  Location: [facility];  Service: Urology;  Laterality: Left;   EXTRACORPOREAL SHOCK WAVE LITHOTRIPSY Left 07/21/2018   Procedure: EXTRACORPOREAL SHOCK WAVE LITHOTRIPSY (ESWL);  Surgeon: Rene Paci, MD;  Location: [facility];  Service: Urology;  Laterality: Left;   EXTRACORPOREAL SHOCK WAVE LITHOTRIPSY Right 01/18/2020   Procedure: RIGHT EXTRACORPOREAL SHOCK WAVE LITHOTRIPSY (ESWL);  Surgeon: Belva Agee, MD;  Location[facility];  Service: Urology;  Laterality: Right;   EXTRACORPOREAL SHOCK WAVE LITHOTRIPSY Right 04/11/2020   Procedure: RIGHT EXTRACORPOREAL SHOCK WAVE LITHOTRIPSY (ESWL);  Surgeon: Bjorn Pippin, MD;  Location: [facility];  Service: Urology;  Laterality: Right;   FEMUR IM NAIL Left 07/14/2013   Procedure: INTRAMEDULLARY (IM) NAIL FEMORAL;  Surgeon: Harvie Junior, MD;  Location: [facility];  Service: Orthopedics;  Laterality: Left;   HEEL SPUR SURGERY Right    bone spur rt heel   KNEE ARTHROPLASTY Left 12/07/2014   Procedure: COMPUTER ASSISTED TOTAL KNEE ARTHROPLASTY, removal of hardware from distal femur;  Surgeon: Jodi Geralds, MD;  Location: [facility];  Service: Orthopedics;  Laterality: Left;   KNEE ARTHROSCOPY Bilateral    LITHOTRIPSY     TOTAL KNEE ARTHROPLASTY Right 03/04/2018   Procedure: TOTAL KNEE ARTHROPLASTY;  Surgeon: Jodi Geralds, MD;  Location: [facility];  Service: Orthopedics;  Laterality: Right;  Adductor Block   TUMOR REMOVAL     Removed from tailbone    Family History  Problem Relation Age of Onset   Hyperlipidemia Mother    Hypertension Mother    Lung cancer Father    Colon polyps Sister    Colon polyps Sister    Diabetes Brother    Diabetes Maternal Grandmother    Breast cancer Cousin    Kidney Stones Daughter     Social History   Tobacco Use   Smoking status: Never   Smokeless tobacco: Never  Vaping Use   Vaping Use: Never used  Substance Use Topics   Alcohol use: No   Drug use: No    Current Outpatient Medications  Medication Sig Dispense Refill   Cholecalciferol (VITAMIN D3) 125 MCG (5000 UT) CAPS Take 1 capsule by mouth daily.     losartan (COZAAR) 25 MG tablet Take 25 mg by mouth daily.     simvastatin (ZOCOR) 20 MG tablet Take 20 mg by mouth once a week.     Current Facility-Administered Medications  Medication Dose Route Frequency Provider Last Rate Last Admin   0.9 %  sodium chloride infusion  500 mL Intravenous Once Lynann Bologna, MD        Allergies  Allergen Reactions   Cortisone Itching and Other (See Comments)    flushing of face    Review of Systems:  Constitutional: Denies fever, chills, diaphoresis, appetite change and fatigue.  HEENT: Denies photophobia, eye pain, redness, hearing loss, ear pain, congestion, sore throat, rhinorrhea, sneezing, mouth sores, neck pain, neck stiffness and tinnitus.   Respiratory: Denies SOB, DOE, cough, chest tightness,  and wheezing.   Cardiovascular: Denies chest pain, palpitations and leg swelling.  Genitourinary: Denies dysuria, urgency, frequency, hematuria, flank pain and difficulty urinating.  Musculoskeletal: Has arthritis Neurological: Denies dizziness, seizures, syncope, weakness, light-headedness, numbness and headaches.  Hematological: Denies adenopathy. Easy bruising, personal or family bleeding history   Psychiatric/Behavioral: No anxiety or depression     Physical Exam:    BP 130/81 (BP Location: Right Arm, Patient Position: Sitting, Cuff Size: Normal)   Pulse 87   Temp 98.4 F (36.9 C) (Temporal)   Ht 5' 3.5" (1.613 m)   Wt 181 lb (82.1 kg)   SpO2 98%   BMI 31.56 kg/m  Wt Readings from Last 3 Encounters:  10/21/22 181 lb (82.1 kg)  09/30/22 181 lb (82.1 kg)  08/19/22 181 lb 8 oz (82.3 kg)   Constitutional:  Well-developed, in no acute distress. Psychiatric: Normal mood and affect. Behavior is normal. HEENT: Pupils normal.  Conjunctivae are normal. No scleral icterus. Cardiovascular: Normal rate, regular rhythm. No edema Pulmonary/chest: Effort normal and breath sounds normal. No wheezing, rales or rhonchi. Abdominal: Soft, nondistended. Nontender. Bowel sounds active throughout. There are no masses palpable. No hepatomegaly.  Small umbilical hernia and small right lower quadrant hernia without tenderness. Rectal: Deferred Neurological: Alert and oriented to person place and time. Skin: Skin is warm and dry. No rashes noted.  Data Reviewed: I have personally reviewed following labs and imaging studies   CBC  [facility]03/12/2022 Component 07/04/2022     WBC 6.0  RBC 4.61  HGB 13.5  HCT 41.6  MCV 90  MCH 29.3  MCHC 32.5  Plt Ct 248  RDW SD 41.9  MPV 10.8   CHEM PROFILE  [facility]03/12/2022 Component 07/04/2022     Na 139  Potassium 4.4  Cl 103  CO2 25  AGAP 11  Glucose 108 High     BUN 14  Creatinine 0.60  Ca 9.8  ALK PHOS 76  T Bili 0.31  Total Protein 7.7  Alb 4.6  GLOBULIN 3.1  ALBUMIN/GLOBULIN RATIO 1.5  BUN/CREAT RATIO 23.3  ALT 21  AST 23  eGFR 97     Edman Circle, MD 10/21/2022, 9:00 AM  Cc: Street, Stephanie Coup, *

## 2022-10-21 NOTE — Progress Notes (Signed)
Called to room to assist during endoscopic procedure.  Patient ID and intended procedure confirmed with present staff. Received instructions for my participation in the procedure from the performing physician.  

## 2022-10-22 ENCOUNTER — Telehealth: Payer: Self-pay | Admitting: *Deleted

## 2022-10-22 NOTE — Telephone Encounter (Signed)
  Follow up Call-     10/21/2022    8:37 AM 10/21/2022    8:23 AM  Call back number  Post procedure Call Back phone  # [phone]   Permission to leave phone message  Yes     Patient questions:  Do you have a fever, pain , or abdominal swelling? No. Pain Score  0 *  Have you tolerated food without any problems? Yes.    Have you been able to return to your normal activities? Yes.    Do you have any questions about your discharge instructions: Diet   No. Medications  No. Follow up visit  No.  Do you have questions or concerns about your Care? No.  Actions: * If pain score is 4 or above: No action needed, pain <4.

## 2022-10-23 DIAGNOSIS — L219 Seborrheic dermatitis, unspecified: Secondary | ICD-10-CM | POA: Diagnosis not present

## 2022-11-02 DIAGNOSIS — L814 Other melanin hyperpigmentation: Secondary | ICD-10-CM | POA: Diagnosis not present

## 2022-11-02 DIAGNOSIS — L821 Other seborrheic keratosis: Secondary | ICD-10-CM | POA: Diagnosis not present

## 2022-11-02 DIAGNOSIS — L570 Actinic keratosis: Secondary | ICD-10-CM | POA: Diagnosis not present

## 2022-11-02 DIAGNOSIS — D225 Melanocytic nevi of trunk: Secondary | ICD-10-CM | POA: Diagnosis not present

## 2022-11-06 ENCOUNTER — Encounter: Payer: Self-pay | Admitting: Gastroenterology

## 2023-01-26 DIAGNOSIS — G5602 Carpal tunnel syndrome, left upper limb: Secondary | ICD-10-CM | POA: Diagnosis not present

## 2023-02-05 DIAGNOSIS — H3530 Unspecified macular degeneration: Secondary | ICD-10-CM | POA: Diagnosis not present

## 2023-02-05 DIAGNOSIS — Z79899 Other long term (current) drug therapy: Secondary | ICD-10-CM | POA: Diagnosis not present

## 2023-02-05 DIAGNOSIS — G5602 Carpal tunnel syndrome, left upper limb: Secondary | ICD-10-CM | POA: Diagnosis not present

## 2023-02-05 DIAGNOSIS — E785 Hyperlipidemia, unspecified: Secondary | ICD-10-CM | POA: Diagnosis not present

## 2023-02-05 DIAGNOSIS — I10 Essential (primary) hypertension: Secondary | ICD-10-CM | POA: Diagnosis not present

## 2023-02-10 DIAGNOSIS — Z23 Encounter for immunization: Secondary | ICD-10-CM | POA: Diagnosis not present

## 2023-03-16 DIAGNOSIS — L219 Seborrheic dermatitis, unspecified: Secondary | ICD-10-CM | POA: Diagnosis not present

## 2023-03-16 DIAGNOSIS — L570 Actinic keratosis: Secondary | ICD-10-CM | POA: Diagnosis not present

## 2023-03-16 DIAGNOSIS — D485 Neoplasm of uncertain behavior of skin: Secondary | ICD-10-CM | POA: Diagnosis not present

## 2023-03-16 DIAGNOSIS — L988 Other specified disorders of the skin and subcutaneous tissue: Secondary | ICD-10-CM | POA: Diagnosis not present

## 2023-03-29 DIAGNOSIS — B079 Viral wart, unspecified: Secondary | ICD-10-CM | POA: Diagnosis not present

## 2023-04-23 DIAGNOSIS — R591 Generalized enlarged lymph nodes: Secondary | ICD-10-CM | POA: Diagnosis not present

## 2023-04-23 DIAGNOSIS — R221 Localized swelling, mass and lump, neck: Secondary | ICD-10-CM | POA: Diagnosis not present

## 2023-04-23 DIAGNOSIS — E041 Nontoxic single thyroid nodule: Secondary | ICD-10-CM | POA: Diagnosis not present

## 2023-04-23 DIAGNOSIS — E669 Obesity, unspecified: Secondary | ICD-10-CM | POA: Diagnosis not present

## 2023-04-29 DIAGNOSIS — R221 Localized swelling, mass and lump, neck: Secondary | ICD-10-CM | POA: Diagnosis not present

## 2023-06-21 DIAGNOSIS — B079 Viral wart, unspecified: Secondary | ICD-10-CM | POA: Diagnosis not present

## 2023-07-09 DIAGNOSIS — Z79899 Other long term (current) drug therapy: Secondary | ICD-10-CM | POA: Diagnosis not present

## 2023-07-09 DIAGNOSIS — E785 Hyperlipidemia, unspecified: Secondary | ICD-10-CM | POA: Diagnosis not present

## 2023-07-09 DIAGNOSIS — M791 Myalgia, unspecified site: Secondary | ICD-10-CM | POA: Diagnosis not present

## 2023-07-09 DIAGNOSIS — M8580 Other specified disorders of bone density and structure, unspecified site: Secondary | ICD-10-CM | POA: Diagnosis not present

## 2023-07-09 DIAGNOSIS — I10 Essential (primary) hypertension: Secondary | ICD-10-CM | POA: Diagnosis not present

## 2023-07-09 DIAGNOSIS — T466X5A Adverse effect of antihyperlipidemic and antiarteriosclerotic drugs, initial encounter: Secondary | ICD-10-CM | POA: Diagnosis not present

## 2023-07-09 DIAGNOSIS — E8881 Metabolic syndrome: Secondary | ICD-10-CM | POA: Diagnosis not present

## 2023-07-09 DIAGNOSIS — E559 Vitamin D deficiency, unspecified: Secondary | ICD-10-CM | POA: Diagnosis not present

## 2023-07-09 DIAGNOSIS — Z0000 Encounter for general adult medical examination without abnormal findings: Secondary | ICD-10-CM | POA: Diagnosis not present

## 2023-10-15 ENCOUNTER — Other Ambulatory Visit: Payer: Self-pay | Admitting: Family Medicine

## 2023-10-15 DIAGNOSIS — Z1231 Encounter for screening mammogram for malignant neoplasm of breast: Secondary | ICD-10-CM

## 2023-10-20 ENCOUNTER — Ambulatory Visit
Admission: RE | Admit: 2023-10-20 | Discharge: 2023-10-20 | Disposition: A | Discharge status: Home / Self Care | Source: Ambulatory Visit | Attending: Family Medicine | Admitting: Family Medicine

## 2023-10-20 DIAGNOSIS — Z1231 Encounter for screening mammogram for malignant neoplasm of breast: Secondary | ICD-10-CM | POA: Diagnosis not present

## 2024-01-19 DIAGNOSIS — L821 Other seborrheic keratosis: Secondary | ICD-10-CM | POA: Diagnosis not present

## 2024-01-19 DIAGNOSIS — L814 Other melanin hyperpigmentation: Secondary | ICD-10-CM | POA: Diagnosis not present

## 2024-01-19 DIAGNOSIS — L820 Inflamed seborrheic keratosis: Secondary | ICD-10-CM | POA: Diagnosis not present

## 2024-01-19 DIAGNOSIS — D2239 Melanocytic nevi of other parts of face: Secondary | ICD-10-CM | POA: Diagnosis not present

## 2024-01-19 DIAGNOSIS — D225 Melanocytic nevi of trunk: Secondary | ICD-10-CM | POA: Diagnosis not present

## 2024-01-19 DIAGNOSIS — D485 Neoplasm of uncertain behavior of skin: Secondary | ICD-10-CM | POA: Diagnosis not present

## 2024-02-08 DIAGNOSIS — C44319 Basal cell carcinoma of skin of other parts of face: Secondary | ICD-10-CM | POA: Diagnosis not present

## 2024-02-15 DIAGNOSIS — N202 Calculus of kidney with calculus of ureter: Secondary | ICD-10-CM | POA: Diagnosis not present

## 2024-02-15 DIAGNOSIS — R1012 Left upper quadrant pain: Secondary | ICD-10-CM | POA: Diagnosis not present

## 2024-02-15 DIAGNOSIS — K429 Umbilical hernia without obstruction or gangrene: Secondary | ICD-10-CM | POA: Diagnosis not present

## 2024-02-15 DIAGNOSIS — N201 Calculus of ureter: Secondary | ICD-10-CM | POA: Diagnosis not present

## 2024-02-15 DIAGNOSIS — R310 Gross hematuria: Secondary | ICD-10-CM | POA: Diagnosis not present

## 2024-03-07 DIAGNOSIS — N200 Calculus of kidney: Secondary | ICD-10-CM | POA: Diagnosis not present

## 2024-03-14 DIAGNOSIS — N200 Calculus of kidney: Secondary | ICD-10-CM | POA: Diagnosis not present

## 2024-03-15 DIAGNOSIS — Z23 Encounter for immunization: Secondary | ICD-10-CM | POA: Diagnosis not present

## 2024-03-30 DIAGNOSIS — Z2821 Immunization not carried out because of patient refusal: Secondary | ICD-10-CM | POA: Diagnosis not present

## 2024-03-30 DIAGNOSIS — Z23 Encounter for immunization: Secondary | ICD-10-CM | POA: Diagnosis not present

## 2024-03-30 DIAGNOSIS — R1032 Left lower quadrant pain: Secondary | ICD-10-CM | POA: Diagnosis not present

## 2024-03-30 DIAGNOSIS — Z6831 Body mass index (BMI) 31.0-31.9, adult: Secondary | ICD-10-CM | POA: Diagnosis not present

## 2024-04-04 DIAGNOSIS — K439 Ventral hernia without obstruction or gangrene: Secondary | ICD-10-CM | POA: Diagnosis not present

## 2024-04-04 DIAGNOSIS — K5730 Diverticulosis of large intestine without perforation or abscess without bleeding: Secondary | ICD-10-CM | POA: Diagnosis not present

## 2024-04-04 DIAGNOSIS — N202 Calculus of kidney with calculus of ureter: Secondary | ICD-10-CM | POA: Diagnosis not present

## 2024-04-04 DIAGNOSIS — N209 Urinary calculus, unspecified: Secondary | ICD-10-CM | POA: Diagnosis not present

## 2024-04-04 DIAGNOSIS — K429 Umbilical hernia without obstruction or gangrene: Secondary | ICD-10-CM | POA: Diagnosis not present

## 2024-04-06 ENCOUNTER — Other Ambulatory Visit: Payer: Self-pay | Admitting: Urology

## 2024-05-23 ENCOUNTER — Ambulatory Visit (HOSPITAL_COMMUNITY): Admit: 2024-05-23 | Admitting: Urology

## 2024-05-23 SURGERY — CYSTOSCOPY/URETEROSCOPY/HOLMIUM LASER/STENT PLACEMENT
Anesthesia: General | Laterality: Left
